# Patient Record
Sex: Male | Born: 1935 | Race: White | Hispanic: No | Smoking: Never smoker
Health system: Southern US, Community
[De-identification: ages and names within clinical notes are randomized; demographics above are authoritative.]

## PROBLEM LIST (undated history)

## (undated) DIAGNOSIS — N189 Chronic kidney disease, unspecified: Secondary | ICD-10-CM

## (undated) DIAGNOSIS — F22 Delusional disorders: Secondary | ICD-10-CM

## (undated) DIAGNOSIS — K219 Gastro-esophageal reflux disease without esophagitis: Secondary | ICD-10-CM

## (undated) DIAGNOSIS — E785 Hyperlipidemia, unspecified: Secondary | ICD-10-CM

## (undated) DIAGNOSIS — E039 Hypothyroidism, unspecified: Secondary | ICD-10-CM

## (undated) DIAGNOSIS — I2510 Atherosclerotic heart disease of native coronary artery without angina pectoris: Secondary | ICD-10-CM

## (undated) DIAGNOSIS — D649 Anemia, unspecified: Secondary | ICD-10-CM

## (undated) DIAGNOSIS — Z87891 Personal history of nicotine dependence: Secondary | ICD-10-CM

## (undated) DIAGNOSIS — R1310 Dysphagia, unspecified: Secondary | ICD-10-CM

## (undated) DIAGNOSIS — F1027 Alcohol dependence with alcohol-induced persisting dementia: Secondary | ICD-10-CM

## (undated) DIAGNOSIS — R41841 Cognitive communication deficit: Secondary | ICD-10-CM

## (undated) DIAGNOSIS — G9340 Encephalopathy, unspecified: Secondary | ICD-10-CM

## (undated) DIAGNOSIS — I10 Essential (primary) hypertension: Secondary | ICD-10-CM

## (undated) DIAGNOSIS — R569 Unspecified convulsions: Secondary | ICD-10-CM

## (undated) DIAGNOSIS — F411 Generalized anxiety disorder: Secondary | ICD-10-CM

## (undated) DIAGNOSIS — F0390 Unspecified dementia without behavioral disturbance: Secondary | ICD-10-CM

## (undated) DIAGNOSIS — I259 Chronic ischemic heart disease, unspecified: Secondary | ICD-10-CM

## (undated) DIAGNOSIS — F329 Major depressive disorder, single episode, unspecified: Secondary | ICD-10-CM

---

## 2014-02-19 ENCOUNTER — Ambulatory Visit: Payer: Self-pay | Admitting: Family Medicine

## 2016-04-13 ENCOUNTER — Emergency Department (HOSPITAL_COMMUNITY): Payer: Medicare Other

## 2016-04-13 ENCOUNTER — Inpatient Hospital Stay (HOSPITAL_COMMUNITY)
Admission: EM | Admit: 2016-04-13 | Discharge: 2016-04-18 | DRG: 177 | Disposition: A | Discharge status: Skilled Nursing Facility | Payer: Medicare Other | Attending: Internal Medicine | Admitting: Internal Medicine

## 2016-04-13 ENCOUNTER — Encounter (HOSPITAL_COMMUNITY): Payer: Self-pay | Admitting: Emergency Medicine

## 2016-04-13 DIAGNOSIS — E876 Hypokalemia: Secondary | ICD-10-CM | POA: Diagnosis present

## 2016-04-13 DIAGNOSIS — R079 Chest pain, unspecified: Secondary | ICD-10-CM | POA: Diagnosis not present

## 2016-04-13 DIAGNOSIS — Z9181 History of falling: Secondary | ICD-10-CM | POA: Diagnosis not present

## 2016-04-13 DIAGNOSIS — H9190 Unspecified hearing loss, unspecified ear: Secondary | ICD-10-CM | POA: Diagnosis present

## 2016-04-13 DIAGNOSIS — Z7902 Long term (current) use of antithrombotics/antiplatelets: Secondary | ICD-10-CM | POA: Diagnosis not present

## 2016-04-13 DIAGNOSIS — I2510 Atherosclerotic heart disease of native coronary artery without angina pectoris: Secondary | ICD-10-CM | POA: Diagnosis present

## 2016-04-13 DIAGNOSIS — I248 Other forms of acute ischemic heart disease: Secondary | ICD-10-CM | POA: Diagnosis present

## 2016-04-13 DIAGNOSIS — K219 Gastro-esophageal reflux disease without esophagitis: Secondary | ICD-10-CM | POA: Diagnosis present

## 2016-04-13 DIAGNOSIS — J189 Pneumonia, unspecified organism: Secondary | ICD-10-CM | POA: Diagnosis present

## 2016-04-13 DIAGNOSIS — R1312 Dysphagia, oropharyngeal phase: Secondary | ICD-10-CM | POA: Diagnosis present

## 2016-04-13 DIAGNOSIS — F0390 Unspecified dementia without behavioral disturbance: Secondary | ICD-10-CM | POA: Diagnosis not present

## 2016-04-13 DIAGNOSIS — I272 Other secondary pulmonary hypertension: Secondary | ICD-10-CM | POA: Diagnosis present

## 2016-04-13 DIAGNOSIS — F411 Generalized anxiety disorder: Secondary | ICD-10-CM | POA: Diagnosis present

## 2016-04-13 DIAGNOSIS — G9341 Metabolic encephalopathy: Secondary | ICD-10-CM | POA: Diagnosis present

## 2016-04-13 DIAGNOSIS — N179 Acute kidney failure, unspecified: Secondary | ICD-10-CM | POA: Diagnosis present

## 2016-04-13 DIAGNOSIS — Z515 Encounter for palliative care: Secondary | ICD-10-CM | POA: Diagnosis present

## 2016-04-13 DIAGNOSIS — Y95 Nosocomial condition: Secondary | ICD-10-CM | POA: Diagnosis present

## 2016-04-13 DIAGNOSIS — G40909 Epilepsy, unspecified, not intractable, without status epilepticus: Secondary | ICD-10-CM | POA: Diagnosis present

## 2016-04-13 DIAGNOSIS — I481 Persistent atrial fibrillation: Secondary | ICD-10-CM | POA: Diagnosis present

## 2016-04-13 DIAGNOSIS — Z66 Do not resuscitate: Secondary | ICD-10-CM | POA: Diagnosis present

## 2016-04-13 DIAGNOSIS — F22 Delusional disorders: Secondary | ICD-10-CM | POA: Diagnosis present

## 2016-04-13 DIAGNOSIS — G9340 Encephalopathy, unspecified: Secondary | ICD-10-CM | POA: Diagnosis not present

## 2016-04-13 DIAGNOSIS — R1310 Dysphagia, unspecified: Secondary | ICD-10-CM | POA: Diagnosis not present

## 2016-04-13 DIAGNOSIS — J9621 Acute and chronic respiratory failure with hypoxia: Secondary | ICD-10-CM | POA: Diagnosis present

## 2016-04-13 DIAGNOSIS — D72829 Elevated white blood cell count, unspecified: Secondary | ICD-10-CM | POA: Diagnosis present

## 2016-04-13 DIAGNOSIS — E785 Hyperlipidemia, unspecified: Secondary | ICD-10-CM | POA: Diagnosis present

## 2016-04-13 DIAGNOSIS — J690 Pneumonitis due to inhalation of food and vomit: Principal | ICD-10-CM | POA: Diagnosis present

## 2016-04-13 DIAGNOSIS — F0391 Unspecified dementia with behavioral disturbance: Secondary | ICD-10-CM | POA: Diagnosis present

## 2016-04-13 DIAGNOSIS — N189 Chronic kidney disease, unspecified: Secondary | ICD-10-CM | POA: Diagnosis present

## 2016-04-13 DIAGNOSIS — Z87891 Personal history of nicotine dependence: Secondary | ICD-10-CM

## 2016-04-13 DIAGNOSIS — I129 Hypertensive chronic kidney disease with stage 1 through stage 4 chronic kidney disease, or unspecified chronic kidney disease: Secondary | ICD-10-CM | POA: Diagnosis present

## 2016-04-13 DIAGNOSIS — R633 Feeding difficulties: Secondary | ICD-10-CM | POA: Diagnosis present

## 2016-04-13 DIAGNOSIS — D638 Anemia in other chronic diseases classified elsewhere: Secondary | ICD-10-CM | POA: Diagnosis present

## 2016-04-13 DIAGNOSIS — I480 Paroxysmal atrial fibrillation: Secondary | ICD-10-CM | POA: Diagnosis present

## 2016-04-13 DIAGNOSIS — R7989 Other specified abnormal findings of blood chemistry: Secondary | ICD-10-CM | POA: Diagnosis present

## 2016-04-13 DIAGNOSIS — J90 Pleural effusion, not elsewhere classified: Secondary | ICD-10-CM | POA: Diagnosis present

## 2016-04-13 DIAGNOSIS — R778 Other specified abnormalities of plasma proteins: Secondary | ICD-10-CM | POA: Diagnosis present

## 2016-04-13 DIAGNOSIS — E870 Hyperosmolality and hypernatremia: Secondary | ICD-10-CM | POA: Diagnosis present

## 2016-04-13 DIAGNOSIS — Z8701 Personal history of pneumonia (recurrent): Secondary | ICD-10-CM

## 2016-04-13 DIAGNOSIS — E039 Hypothyroidism, unspecified: Secondary | ICD-10-CM | POA: Diagnosis present

## 2016-04-13 DIAGNOSIS — E860 Dehydration: Secondary | ICD-10-CM | POA: Diagnosis present

## 2016-04-13 DIAGNOSIS — R05 Cough: Secondary | ICD-10-CM | POA: Diagnosis present

## 2016-04-13 DIAGNOSIS — I4819 Other persistent atrial fibrillation: Secondary | ICD-10-CM

## 2016-04-13 HISTORY — DX: Essential (primary) hypertension: I10

## 2016-04-13 HISTORY — DX: Cognitive communication deficit: R41.841

## 2016-04-13 HISTORY — DX: Unspecified convulsions: R56.9

## 2016-04-13 HISTORY — DX: Alcohol dependence with alcohol-induced persisting dementia: F10.27

## 2016-04-13 HISTORY — DX: Hyperlipidemia, unspecified: E78.5

## 2016-04-13 HISTORY — DX: Chronic kidney disease, unspecified: N18.9

## 2016-04-13 HISTORY — DX: Dysphagia, unspecified: R13.10

## 2016-04-13 HISTORY — DX: Gastro-esophageal reflux disease without esophagitis: K21.9

## 2016-04-13 HISTORY — DX: Delusional disorders: F22

## 2016-04-13 HISTORY — DX: Atherosclerotic heart disease of native coronary artery without angina pectoris: I25.10

## 2016-04-13 HISTORY — DX: Personal history of nicotine dependence: Z87.891

## 2016-04-13 HISTORY — DX: Encephalopathy, unspecified: G93.40

## 2016-04-13 HISTORY — DX: Anemia, unspecified: D64.9

## 2016-04-13 HISTORY — DX: Hypothyroidism, unspecified: E03.9

## 2016-04-13 HISTORY — DX: Major depressive disorder, single episode, unspecified: F32.9

## 2016-04-13 HISTORY — DX: Generalized anxiety disorder: F41.1

## 2016-04-13 HISTORY — DX: Unspecified dementia, unspecified severity, without behavioral disturbance, psychotic disturbance, mood disturbance, and anxiety: F03.90

## 2016-04-13 HISTORY — DX: Chronic ischemic heart disease, unspecified: I25.9

## 2016-04-13 LAB — VALPROIC ACID LEVEL: Valproic Acid Lvl: 37 ug/mL — ABNORMAL LOW (ref 50.0–100.0)

## 2016-04-13 LAB — CBC WITH DIFFERENTIAL/PLATELET
Basophils Absolute: 0 10*3/uL (ref 0.0–0.1)
Basophils Relative: 0 %
EOS ABS: 0 10*3/uL (ref 0.0–0.7)
EOS PCT: 0 %
HCT: 27.6 % — ABNORMAL LOW (ref 39.0–52.0)
HEMOGLOBIN: 8.9 g/dL — AB (ref 13.0–17.0)
LYMPHS ABS: 1.6 10*3/uL (ref 0.7–4.0)
Lymphocytes Relative: 11 %
MCH: 29.3 pg (ref 26.0–34.0)
MCHC: 32.2 g/dL (ref 30.0–36.0)
MCV: 90.8 fL (ref 78.0–100.0)
MONOS PCT: 7 %
Monocytes Absolute: 1 10*3/uL (ref 0.1–1.0)
NEUTROS PCT: 82 %
Neutro Abs: 11.8 10*3/uL — ABNORMAL HIGH (ref 1.7–7.7)
Platelets: 299 10*3/uL (ref 150–400)
RBC: 3.04 MIL/uL — ABNORMAL LOW (ref 4.22–5.81)
RDW: 14.5 % (ref 11.5–15.5)
WBC: 14.4 10*3/uL — ABNORMAL HIGH (ref 4.0–10.5)

## 2016-04-13 LAB — TROPONIN I
Troponin I: 0.47 ng/mL — ABNORMAL HIGH (ref <0.031)
Troponin I: 0.41 ng/mL — ABNORMAL HIGH (ref <0.031)
Troponin I: 0.36 ng/mL — ABNORMAL HIGH (ref <0.031)

## 2016-04-13 LAB — COMPREHENSIVE METABOLIC PANEL
ALBUMIN: 2.9 g/dL — AB (ref 3.5–5.0)
ALK PHOS: 47 U/L (ref 38–126)
ALT: 21 U/L (ref 17–63)
AST: 23 U/L (ref 15–41)
Anion gap: 9 (ref 5–15)
BUN: 27 mg/dL — ABNORMAL HIGH (ref 6–20)
CALCIUM: 8.9 mg/dL (ref 8.9–10.3)
CHLORIDE: 109 mmol/L (ref 101–111)
CO2: 29 mmol/L (ref 22–32)
CREATININE: 1.31 mg/dL — AB (ref 0.61–1.24)
GFR calc non Af Amer: 47 mL/min — ABNORMAL LOW (ref >60)
GFR, EST AFRICAN AMERICAN: 55 mL/min — AB (ref >60)
GLUCOSE: 129 mg/dL — AB (ref 65–99)
Potassium: 3.6 mmol/L (ref 3.5–5.1)
SODIUM: 147 mmol/L — AB (ref 135–145)
Total Bilirubin: 0.6 mg/dL (ref 0.3–1.2)
Total Protein: 7 g/dL (ref 6.5–8.1)

## 2016-04-13 LAB — PROTIME-INR
INR: 1.35 (ref 0.00–1.49)
PROTHROMBIN TIME: 16.8 s — AB (ref 11.6–15.2)

## 2016-04-13 LAB — MRSA PCR SCREENING: MRSA BY PCR: NEGATIVE

## 2016-04-13 LAB — LACTIC ACID, PLASMA: LACTIC ACID, VENOUS: 1.4 mmol/L (ref 0.5–2.0)

## 2016-04-13 MED ORDER — DEXTROSE 5 % IV SOLN
1.0000 g | Freq: Once | INTRAVENOUS | Status: AC
  Administered 2016-04-13: 1 g via INTRAVENOUS
  Filled 2016-04-13: qty 1

## 2016-04-13 MED ORDER — VANCOMYCIN HCL 10 G IV SOLR
1750.0000 mg | Freq: Once | INTRAVENOUS | Status: AC
  Administered 2016-04-13: 1750 mg via INTRAVENOUS
  Filled 2016-04-13: qty 1750

## 2016-04-13 MED ORDER — ACETAMINOPHEN 650 MG RE SUPP: 650.0000 mg | Freq: Four times a day (QID) | RECTAL | Status: DC | PRN

## 2016-04-13 MED ORDER — ONDANSETRON HCL 4 MG/2ML IJ SOLN: 4.0000 mg | Freq: Four times a day (QID) | INTRAMUSCULAR | Status: DC | PRN

## 2016-04-13 MED ORDER — ADULT MULTIVITAMIN W/MINERALS CH
1.0000 | ORAL_TABLET | Freq: Every day | ORAL | Status: DC
  Administered 2016-04-14 – 2016-04-18 (×5): 1 via ORAL
  Filled 2016-04-13 (×5): qty 1

## 2016-04-13 MED ORDER — ESCITALOPRAM OXALATE 10 MG PO TABS
10.0000 mg | ORAL_TABLET | Freq: Every day | ORAL | Status: DC
  Administered 2016-04-13 – 2016-04-18 (×6): 10 mg via ORAL
  Filled 2016-04-13 (×6): qty 1

## 2016-04-13 MED ORDER — ACETAMINOPHEN 325 MG PO TABS
650.0000 mg | ORAL_TABLET | Freq: Four times a day (QID) | ORAL | Status: DC | PRN
  Administered 2016-04-14: 650 mg via ORAL
  Filled 2016-04-13: qty 2

## 2016-04-13 MED ORDER — SODIUM CHLORIDE 0.9 % IV SOLN
INTRAVENOUS | Status: AC
  Administered 2016-04-13: 12:00:00 via INTRAVENOUS

## 2016-04-13 MED ORDER — VALPROIC ACID 250 MG PO CAPS
250.0000 mg | ORAL_CAPSULE | Freq: Three times a day (TID) | ORAL | Status: DC
  Administered 2016-04-13 (×2): 250 mg via ORAL
  Filled 2016-04-13 (×9): qty 1

## 2016-04-13 MED ORDER — CLONAZEPAM 0.5 MG PO TABS
0.2500 mg | ORAL_TABLET | Freq: Every day | ORAL | Status: DC
  Administered 2016-04-13 – 2016-04-17 (×4): 0.25 mg via ORAL
  Filled 2016-04-13 (×4): qty 1

## 2016-04-13 MED ORDER — CLOPIDOGREL BISULFATE 75 MG PO TABS
75.0000 mg | ORAL_TABLET | Freq: Every day | ORAL | Status: DC
  Administered 2016-04-13 – 2016-04-18 (×6): 75 mg via ORAL
  Filled 2016-04-13 (×6): qty 1

## 2016-04-13 MED ORDER — SODIUM CHLORIDE 0.9 % IV SOLN
3.0000 g | Freq: Three times a day (TID) | INTRAVENOUS | Status: DC
  Administered 2016-04-13 – 2016-04-18 (×15): 3 g via INTRAVENOUS
  Filled 2016-04-13 (×18): qty 3

## 2016-04-13 MED ORDER — RISPERIDONE 1 MG PO TABS
2.0000 mg | ORAL_TABLET | Freq: Two times a day (BID) | ORAL | Status: DC
  Administered 2016-04-13 – 2016-04-18 (×9): 2 mg via ORAL
  Filled 2016-04-13 (×9): qty 2

## 2016-04-13 MED ORDER — SODIUM CHLORIDE 0.9 % IV BOLUS (SEPSIS)
1000.0000 mL | Freq: Once | INTRAVENOUS | Status: AC
  Administered 2016-04-13: 1000 mL via INTRAVENOUS

## 2016-04-13 MED ORDER — VANCOMYCIN HCL 10 G IV SOLR
1250.0000 mg | INTRAVENOUS | Status: DC
  Filled 2016-04-13: qty 1250

## 2016-04-13 MED ORDER — SENNOSIDES-DOCUSATE SODIUM 8.6-50 MG PO TABS: 1.0000 | ORAL_TABLET | Freq: Every evening | ORAL | Status: DC | PRN

## 2016-04-13 MED ORDER — ENOXAPARIN SODIUM 40 MG/0.4ML ~~LOC~~ SOLN
40.0000 mg | SUBCUTANEOUS | Status: DC
  Administered 2016-04-13 – 2016-04-17 (×4): 40 mg via SUBCUTANEOUS
  Filled 2016-04-13 (×4): qty 0.4

## 2016-04-13 MED ORDER — ONDANSETRON HCL 4 MG PO TABS: 4.0000 mg | ORAL_TABLET | Freq: Four times a day (QID) | ORAL | Status: DC | PRN

## 2016-04-13 MED ORDER — SODIUM CHLORIDE 0.45 % IV SOLN
INTRAVENOUS | Status: DC
  Administered 2016-04-13 – 2016-04-14 (×2): via INTRAVENOUS

## 2016-04-13 MED ORDER — ISOSORBIDE DINITRATE 20 MG PO TABS
30.0000 mg | ORAL_TABLET | Freq: Every day | ORAL | Status: DC
  Administered 2016-04-14 – 2016-04-18 (×5): 30 mg via ORAL
  Filled 2016-04-13 (×5): qty 2

## 2016-04-13 NOTE — Progress Notes (Signed)
Pharmacy Antibiotic Note  Jerry [name] is a 82 y.o. male admitted on 04/13/2016 with pneumonia.  Pharmacy has been consulted for vancomycin dosing. He was [facility] with dx of PNA  Plan: Vanc 1750 mg IV X 1 then 1250 mg IV q24 hours F/u renal function, cultures and clinical course  Height: 5\' 11"  (180.3 cm) Weight: 195 lb (88.451 kg) IBW/kg (Calculated) : 75.3  Temp (24hrs), Avg:99.9 F (37.7 C), Min:99.9 F (37.7 C), Max:99.9 F (37.7 C)   Recent Labs Lab 04/13/16 1030  WBC 14.4*  CREATININE 1.31*  LATICACIDVEN 1.4    Estimated Creatinine Clearance: 42.3 mL/min (by C-G formula based on Cr of 1.31).    No Known Allergies  Antimicrobials this admission: vanc 4/25 >>  cefepime 4/25 >>   Thank you for allowing pharmacy to be a part of this patient's care.  Talbert CageSeay, Tadao Emig Poteet 04/13/2016 11:51 AM

## 2016-04-13 NOTE — Progress Notes (Signed)
Pharmacy Antibiotic Note-update  Jerry [name] is a 82 y.o. male admitted on 04/13/2016 with aspiration  pneumonia.  Pharmacy has been consulted for unasyn dosing. He was [facility] on [facility] with dx of PNA  Plan: Unasyn 3gm IV q8h F/u renal function, cultures and clinical course  Height: 5\' 11"  (180.3 cm) Weight: 180 lb 1.9 oz (81.7 kg) IBW/kg (Calculated) : 75.3  Temp (24hrs), Avg:98.9 F (37.2 C), Min:97.9 F (36.6 C), Max:99.9 F (37.7 C)   Recent Labs Lab 04/13/16 1030  WBC 14.4*  CREATININE 1.31*  LATICACIDVEN 1.4    Estimated Creatinine Clearance: 42.3 mL/min (by C-G formula based on Cr of 1.31).    No Known Allergies  Antimicrobials this admission: vanc 4/25 >> 4/25 cefepime 4/25 >>4/25 Unasyn 4/25>>    Thank you for allowing pharmacy to be a part of this patient's care.  Jerry [name], BS Jerry [name], [place] Clinical Pharmacist Pager [phone] 04/13/2016 5:09 PM

## 2016-04-13 NOTE — H&P (Signed)
History and Physical    Axell Garrido ZOX:[id number] DOB: 03/28/1926 DOA: 04/13/2016  Referring MD/NP/PA: Sheliah Hatch, M.D. PCP: Audree Bane, DO  Patient coming from: Skilled nursing facility  Chief Complaint: Cough, lethargy  HPI: Jerry [name] is a 82 y.o. male with multiple medical comorbidities including ischemic heart disease, seizure disorder, hypertension, severe dementia who presents to the hospital today with the above complaints. Unfortunately patient is not very talkative and am unable to obtain history from him. History is obtained by chart review. It appears that over the past week patient has had worsening cough with increased sputum production. He has not been febrile. He has severe dementia at baseline. He was found to be hypoxic with O2 saturations about 88% upon arrival to the hospital. Chest x-ray shows a right lower lobe pneumonia, he has a sodium of 147, creatinine of 1.3 and a troponin level is 0.47, WBC count of 14.4. We have been asked to admit him for further evaluation and management   Past Medical History  Diagnosis Date  . Ischemic heart disease     on plavix  . Encephalopathy   . Hypertension   . Alcohol dependence with alcohol-induced persisting dementia (HCC)   . Dysphagia   . GAD (generalized anxiety disorder)   . CKD (chronic kidney disease)   . MDD (major depressive disorder) (HCC)   . Hyperlipemia   . Cognitive communication deficit   . GERD (gastroesophageal reflux disease)   . Hypothyroidism   . Anemia   . Dementia   . Delusional disorder (HCC)   . Seizures (HCC)     on Valproic acid  . Coronary artery disease   . Former smoker, stopped smoking in distant past     History reviewed. No pertinent past surgical history.   reports that he drinks alcohol. His tobacco and drug histories are not on file.  No Known Allergies  Family history: Unable to obtain given his severe dementia  Prior to Admission medications   Medication Sig  Start Date End Date Taking? Authorizing Provider  acetaminophen (TYLENOL) 325 MG tablet Take 650 mg by mouth every 6 (six) hours as needed.   Yes Historical Provider, MD  bisacodyl (DULCOLAX) 10 MG suppository Place 10 mg rectally every 3 (three) days as needed for moderate constipation.   Yes Historical Provider, MD  clonazePAM (KLONOPIN) 0.25 MG disintegrating tablet Take 0.25 mg by mouth at bedtime.   Yes Historical Provider, MD  clopidogrel (PLAVIX) 75 MG tablet Take 75 mg by mouth daily.   Yes Historical Provider, MD  escitalopram (LEXAPRO) 10 MG tablet Take 10 mg by mouth daily.   Yes Historical Provider, MD  isosorbide dinitrate (ISORDIL) 30 MG tablet Take 30 mg by mouth daily.   Yes Historical Provider, MD  lactulose (CHRONULAC) 10 GM/15ML solution Place 10 g rectally every 3 (three) days as needed for mild constipation.   Yes Historical Provider, MD  LORazepam (ATIVAN) 2 MG/ML injection Inject 2 mg into the vein every 2 (two) hours as needed for anxiety.   Yes Historical Provider, MD  Multiple Vitamins-Minerals (MULTIVITAMIN WITH MINERALS) tablet Take 1 tablet by mouth daily.   Yes Historical Provider, MD  risperiDONE (RISPERDAL) 2 MG tablet Take 2 mg by mouth 2 (two) times daily.   Yes Historical Provider, MD  senna (SENOKOT) 8.6 MG tablet Take 2 tablets by mouth daily.   Yes Historical Provider, MD  Valproic Acid 250 MG CPDR Take 1 capsule by mouth 3 (three) times daily.  Yes Historical Provider, MD    Review of Systems:  Unable to obtain given his severe dementia   Physical Exam: Filed Vitals:   04/13/16 1200 04/13/16 1230 04/13/16 1300 04/13/16 1442  BP: 137/84 125/73 131/71 125/67  Pulse: 86 76  82  Temp:    97.9 F (36.6 C)  TempSrc:    Oral  Resp: 17 18 18 20   Height:      Weight:    81.7 kg (180 lb 1.9 oz)  SpO2: 97% 98% 98% 94%      Constitutional: NAD, calm, comfortable Eyes: PERRL, lids and conjunctivae normal ENMT: Mucous membranes are moist. Posterior  pharynx clear of any exudate or lesions.Normal dentition.  Neck: normal, supple, no masses, no thyromegaly Respiratory: Coarse bilateral breath sounds, decreased breath sounds right base  Cardiovascular: Regular rate and rhythm, no murmurs / rubs / gallops. No extremity edema. 2+ pedal pulses. No carotid bruits.  Abdomen: no tenderness, no masses palpated. No hepatosplenomegaly. Bowel sounds positive.  Musculoskeletal: no clubbing / cyanosis. No joint deformity upper and lower extremities. Good ROM, no contractures. Normal muscle tone.  Skin: no rashes, lesions, ulcers. No induration Neurologic: CN 2-12 grossly intact. Sensation intact, DTR normal. Strength 5/5 in all 4.  Psychiatric: Normal judgment and insight. Alert and oriented x 3. Normal mood.    Labs on Admission: I have personally reviewed following labs and imaging studies  CBC:  Recent Labs Lab 04/13/16 1030  WBC 14.4*  NEUTROABS 11.8*  HGB 8.9*  HCT 27.6*  MCV 90.8  PLT 299   Basic Metabolic Panel:  Recent Labs Lab 04/13/16 1030  NA 147*  K 3.6  CL 109  CO2 29  GLUCOSE 129*  BUN 27*  CREATININE 1.31*  CALCIUM 8.9   GFR: Estimated Creatinine Clearance: 42.3 mL/min (by C-G formula based on Cr of 1.31). Liver Function Tests:  Recent Labs Lab 04/13/16 1030  AST 23  ALT 21  ALKPHOS 47  BILITOT 0.6  PROT 7.0  ALBUMIN 2.9*   No results for input(s): LIPASE, AMYLASE in the last 168 hours. No results for input(s): AMMONIA in the last 168 hours. Coagulation Profile:  Recent Labs Lab 04/13/16 1030  INR 1.35   Cardiac Enzymes:  Recent Labs Lab 04/13/16 1030  TROPONINI 0.47*   BNP (last 3 results) No results for input(s): PROBNP in the last 8760 hours. HbA1C: No results for input(s): HGBA1C in the last 72 hours. CBG: No results for input(s): GLUCAP in the last 168 hours. Lipid Profile: No results for input(s): CHOL, HDL, LDLCALC, TRIG, CHOLHDL, LDLDIRECT in the last 72 hours. Thyroid Function  Tests: No results for input(s): TSH, T4TOTAL, FREET4, T3FREE, THYROIDAB in the last 72 hours. Anemia Panel: No results for input(s): VITAMINB12, FOLATE, FERRITIN, TIBC, IRON, RETICCTPCT in the last 72 hours. Urine analysis: No results found for: COLORURINE, APPEARANCEUR, LABSPEC, PHURINE, GLUCOSEU, HGBUR, BILIRUBINUR, KETONESUR, PROTEINUR, UROBILINOGEN, NITRITE, LEUKOCYTESUR Sepsis Labs: @LABRCNTIP (procalcitonin:4,lacticidven:4) )No results found for this or any previous visit (from the past 240 hour(s)).   Radiological Exams on Admission: Dg Chest Portable 1 View  04/13/2016  CLINICAL DATA:  Cough, possible pneumonia EXAM: PORTABLE CHEST 1 VIEW COMPARISON:  None. FINDINGS: Cardiomediastinal silhouette is unremarkable. There is hazy infiltrate in right base suspicious for pneumonia. No pulmonary edema. Mild left basilar atelectasis or infiltrate. IMPRESSION: There is hazy infiltrate in right base suspicious for pneumonia. No pulmonary edema. Mild left basilar atelectasis or infiltrate. Followup PA and lateral chest X-ray is recommended in 3-4 weeks  following trial of antibiotic therapy to ensure resolution and exclude underlying malignancy. Electronically Signed   By: Natasha Mead M.D.   On: 04/13/2016 10:43    EKG: Independently reviewed. Atrial fibrillation at a rate of 92, no acute ischemic abnormalities  Assessment/Plan Active Problems:   HCAP (healthcare-associated pneumonia)   Dementia   ARF (acute renal failure) (HCC)   Hypernatremia   Leukocytosis   Elevated troponin   Aspiration pneumonia (HCC)    Acute on chronic metabolic encephalopathy -Likely due to pneumonia on top of baseline dementia.  Hospital-acquired pneumonia/aspiration pneumonia -Given patient's severe dementia and infiltrate located at right base, suspect this is aspiration nature. -We'll request Unasyn dosed as per pharmacy, blood and sputum cultures requested. -Has already been seen by speech therapy who will  proceed with a modified barium swallow in a.m.  Acute renal failure  -suspect due to dehydration. -Follow with IV fluids.  Hypernatremia -Sodium was 147 on admission, suspect due to dementia and decreased intravascular volume. -Start on half normal saline at 75 mL an hour and recheck in a.m.  Elevated troponin -We'll continue to cycle troponins, currently no acute ischemic abnormalities on EKG although he does have what appears to be new onset A. fib. -Check 2-D echo, no further workup anticipated as long as ejection fraction is intact and no wall motion abnormalities.  New onset atrial fibrillation -Rate controlled. -Would not be a candidate for anticoagulation given his severe dementia and fall risk.  Dementia -At baseline   DVT prophylaxis: Lovenox  Code Status: Presumed full code  Family Communication: Patient only  Disposition Plan: To be determined  Consults called: Palliative care consult requested given advanced age, dementia and aspiration pneumonia   Admission status: Inpatient    Time Spent: 95 minutes  Chaya Jan MD [facility] Pager [phone]  If 7PM-7AM, please contact night-coverage [email or web address] Password [facility]  04/13/2016, 4:44 PM

## 2016-04-13 NOTE — ED Provider Notes (Signed)
CSN: [id number]     Arrival date & time 04/13/16  4098 History   First MD Initiated Contact with Patient 04/13/16 1017     Chief Complaint  Patient presents with  . Cough    (Consider location/radiation/quality/duration/timing/severity/associated sxs/prior Treatment) Patient is a 82 y.o. Jerry [name] presenting with cough. The history is provided by the nursing home, the EMS personnel and medical records.  Cough *Patient unable to provide history due to mental status  82 y/o man brought to ED from nursing home where he was noted to have worsening cough and increased sputum production. These symptoms were progressive over the past week. He has not been clearly febrile during the interval. His mental status is questionably worsened with incoherent speech since this morning, however he has severe dementia at baseline. After arrival in the ED he is noted to be mildly hypoxic with O2 saturation <88% that improves with supplemental oxygen. Heart rate mildly tachycardic in Afib which is not documented in nursing home records PTA.  Past Medical History  Diagnosis Date  . Ischemic heart disease     on plavix  . Encephalopathy   . Hypertension   . Alcohol dependence with alcohol-induced persisting dementia (HCC)   . Dysphagia   . GAD (generalized anxiety disorder)   . CKD (chronic kidney disease)   . MDD (major depressive disorder) (HCC)   . Hyperlipemia   . Cognitive communication deficit   . GERD (gastroesophageal reflux disease)   . Hypothyroidism   . Anemia   . Dementia   . Delusional disorder (HCC)   . Seizures (HCC)     on Valproic acid  . Coronary artery disease    History reviewed. No pertinent past surgical history. History reviewed. No pertinent family history. Social History  Substance Use Topics  . Smoking status: Unknown If Ever Smoked  . Smokeless tobacco: None  . Alcohol Use: Yes     Comment: previous alcohol abuse    Review of Systems  Unable to perform ROS: Mental  status change  Respiratory: Positive for cough.   Level V caveat due to patient dementia, AMS, mostly nonverbal status.    Allergies  Review of patient's allergies indicates no known allergies.  Home Medications   Prior to Admission medications   Medication Sig Start Date End Date Taking? Authorizing Provider  acetaminophen (TYLENOL) 325 MG tablet Take 650 mg by mouth every 6 (six) hours as needed.   Yes Historical Provider, MD  bisacodyl (DULCOLAX) 10 MG suppository Place 10 mg rectally every 3 (three) days as needed for moderate constipation.   Yes Historical Provider, MD  clonazePAM (KLONOPIN) 0.25 MG disintegrating tablet Take 0.25 mg by mouth at bedtime.   Yes Historical Provider, MD  clopidogrel (PLAVIX) 75 MG tablet Take 75 mg by mouth daily.   Yes Historical Provider, MD  escitalopram (LEXAPRO) 10 MG tablet Take 10 mg by mouth daily.   Yes Historical Provider, MD  isosorbide dinitrate (ISORDIL) 30 MG tablet Take 30 mg by mouth daily.   Yes Historical Provider, MD  lactulose (CHRONULAC) 10 GM/15ML solution Place 10 g rectally every 3 (three) days as needed for mild constipation.   Yes Historical Provider, MD  LORazepam (ATIVAN) 2 MG/ML injection Inject 2 mg into the vein every 2 (two) hours as needed for anxiety.   Yes Historical Provider, MD  Multiple Vitamins-Minerals (MULTIVITAMIN WITH MINERALS) tablet Take 1 tablet by mouth daily.   Yes Historical Provider, MD  risperiDONE (RISPERDAL) 2 MG tablet Take 2  mg by mouth 2 (two) times daily.   Yes Historical Provider, MD  senna (SENOKOT) 8.6 MG tablet Take 2 tablets by mouth daily.   Yes Historical Provider, MD  Valproic Acid 250 MG CPDR Take 1 capsule by mouth 3 (three) times daily.   Yes Historical Provider, MD   BP 125/67 mmHg  Pulse 82  Temp(Src) 97.9 F (36.6 C) (Oral)  Resp 20  Ht  (1.803 m)  Wt 81.7 kg  BMI 25.13 kg/m2  SpO2 94% Physical Exam GENERAL- alert, co-operative, NAD HEENT- Conjunctiva noninjected, oral  mucosa appears moist, no cervical LN enlargement. CARDIAC- Regular rate, irregular rhythm, early systolic murmur on LUSB RESP- No wheezes or crackles, decreased breath sounds in right base ABDOMEN- Soft, no guarding NEURO- Limited by patient mental status EXTREMITIES- symmetric, no pedal edema. SKIN- Warm, dry, No rash or lesion. PSYCH- Speaking in single words, inappropriate or no responses to questions   ED Course  Procedures (including critical care time) Labs Review Labs Reviewed  CBC WITH DIFFERENTIAL/PLATELET - Abnormal; Notable for the following:    WBC 14.4 (*)    RBC 3.04 (*)    Hemoglobin 8.9 (*)    HCT 27.6 (*)    Neutro Abs 11.8 (*)    All other components within normal limits  COMPREHENSIVE METABOLIC PANEL - Abnormal; Notable for the following:    Sodium 147 (*)    Glucose, Bld 129 (*)    BUN 27 (*)    Creatinine, Ser 1.31 (*)    Albumin 2.9 (*)    GFR calc non Af Amer 47 (*)    GFR calc Af Amer 55 (*)    All other components within normal limits  TROPONIN I - Abnormal; Notable for the following:    Troponin I 0.47 (*)    All other components within normal limits  PROTIME-INR - Abnormal; Notable for the following:    Prothrombin Time 16.8 (*)    All other components within normal limits  VALPROIC ACID LEVEL - Abnormal; Notable for the following:    Valproic Acid Lvl 37 (*)    All other components within normal limits  LACTIC ACID, PLASMA    Imaging Review Dg Chest Portable 1 View  04/13/2016  CLINICAL DATA:  Cough, possible pneumonia EXAM: PORTABLE CHEST 1 VIEW COMPARISON:  None. FINDINGS: Cardiomediastinal silhouette is unremarkable. There is hazy infiltrate in right base suspicious for pneumonia. No pulmonary edema. Mild left basilar atelectasis or infiltrate. IMPRESSION: There is hazy infiltrate in right base suspicious for pneumonia. No pulmonary edema. Mild left basilar atelectasis or infiltrate. Followup PA and lateral chest X-ray is recommended in 3-4  weeks following trial of antibiotic therapy to ensure resolution and exclude underlying malignancy. Electronically Signed   By: Natasha Mead M.D.   On: 04/13/2016 10:43   I have personally reviewed and evaluated these images and lab results as part of my medical decision-making.   EKG Interpretation   Date/Time:  Tuesday April 13 2016 10:18:18 EDT Ventricular Rate:  92 PR Interval:    QRS Duration: 114 QT Interval:  408 QTC Calculation: 505 R Axis:   -58 Text Interpretation:  Atrial fibrillation Incomplete left bundle branch  block Prolonged QT interval Confirmed by ZAVITZ  MD, JOSHUA ([id number]) on  04/13/2016 3:26:20 PM      MDM   Final diagnoses:  HCAP (healthcare-associated pneumonia)   82 y/o man presenting from nursing home with clinical and radiographic findings consistent with pneumonia. He is requiring supplemental  oxygen. Heart rate is irregular in afib since presentation. Mild troponin elevation possibly demand ischemia. Laboratory studies showing leukocytosis, dehydration, SCr likely consistent with a baseline CKD. It is unclear if his mental status is changed from baseline. Started empiric antibiotic coverage with vancomycin and cefepime and consult hospitalist service for admission.    Fuller Planhristopher W Jackie Russman, MD 04/13/16 1549  Blane OharaJoshua Zavitz, MD 04/14/16 [phone]

## 2016-04-13 NOTE — ED Notes (Signed)
Medical records requested from [facility]

## 2016-04-13 NOTE — ED Notes (Addendum)
[age] yom presents from [facility] via EMS with c/c cough. Per EMS, facility states was dc'd from [facility] 4/21 with Dx Pneumonia but concerned pt has continued with productive cough. Denies fever.  Baseline mental status unchanged.   PMHx: dementia, dysphagia, encephalopathy 2/2 alcohol disorder, major depressive disorder, CKD

## 2016-04-13 NOTE — Evaluation (Signed)
Clinical/Bedside Swallow Evaluation Patient Details  Name: Jerry [name] MRN: [id number] Date of Birth: December 10, 1926  Today's Date: 04/13/2016 Time: SLP Start Time (ACUTE ONLY): 1505 SLP Stop Time (ACUTE ONLY): 1555 SLP Time Calculation (min) (ACUTE ONLY): 50 min  Past Medical History:  Past Medical History  Diagnosis Date  . Ischemic heart disease     on plavix  . Encephalopathy   . Hypertension   . Alcohol dependence with alcohol-induced persisting dementia (HCC)   . Dysphagia   . GAD (generalized anxiety disorder)   . CKD (chronic kidney disease)   . MDD (major depressive disorder) (HCC)   . Hyperlipemia   . Cognitive communication deficit   . GERD (gastroesophageal reflux disease)   . Hypothyroidism   . Anemia   . Dementia   . Delusional disorder (HCC)   . Seizures (HCC)     on Valproic acid  . Coronary artery disease   . Former smoker, stopped smoking in distant past    Past Surgical History: History reviewed. No pertinent past surgical history. HPI:  Jerry [name] is a 82 y/o man brought to ED from nursing home where he was noted to have worsening cough and increased sputum production. These symptoms were progressive over the past week. He has not been clearly febrile during the interval. His mental status is questionably worsened with incoherent speech since this morning, however he has severe dementia at baseline. After arrival in the ED he is noted to be mildly hypoxic with O2 saturation <88% that improves with supplemental oxygen. Heart rate mildly tachycardic in Afib which is not documented in nursing home records PTA. He was recently discharged from [facility] on 4/21 with dx of PNA.   Assessment / Plan / Recommendation Clinical Impression  Pt was evaluated at bedside presenting with moderate oropharyngeal dysphagia. Oral mechanism exam revealed dried copious secretions on his upper and lower labial surfaces, edentulous status thick secretions in oral  cavity, and a yellow coating on the entire lingual surface. Secretions were cleared with oral care but lingual coating was unable to be cleared despite consistent clearance attempts with toothettes; state of lingual surface reported to nursing. Note congested sounding cough and persistent coughing prior to any PO trials. During thin trials, the pt demonstrated inconsistent wet vocal quality and delayed congested coughing. Pt demonstrated no wet vocal quality with solids presented, although he did demonstrate delayed coughing. Pt consumed NTL with worsening coughing episodes and increased sounds of congestion. Suspect delayed swallow trigger with all consistencies and increased residuals with NTL, therefore, Recommend thin liquids/D2 (ground); thin liquids to be administered by cup only (NO STRAWS). Furthermore recommend MBS to objectively assess the swallow function and determine least restrictive diet d/t inconsistent s/sx observed and multiple noted risk factors including compromised respiratory status and Chest X-ray showing hazy infiltrate in right base suspicious for pneumonia.    Aspiration Risk  Moderate aspiration risk    Diet Recommendation Dysphagia 2 (Fine chop);Thin liquid   Medication Administration: Crushed with puree Supervision: Full supervision/cueing for compensatory strategies Compensations: Slow rate;Small sips/bites;Multiple dry swallows after each bite/sip;Follow solids with liquid;Effortful swallow Postural Changes: Seated upright at 90 degrees    Other  Recommendations Oral Care Recommendations: Oral care BID   Follow up Recommendations  Skilled Nursing facility    Frequency and Duration min 2x/week  2 weeks       Prognosis Prognosis for Safe Diet Advancement: Fair Barriers to Reach Goals: Cognitive deficits      Swallow Study  General Date of Onset: 04/13/16 HPI: Jerry [name] is a 82 y/o man brought to ED from nursing home where he was noted to have  worsening cough and increased sputum production. These symptoms were progressive over the past week. He has not been clearly febrile during the interval. His mental status is questionably worsened with incoherent speech since this morning, however he has severe dementia at baseline. After arrival in the ED he is noted to be mildly hypoxic with O2 saturation <88% that improves with supplemental oxygen. Heart rate mildly tachycardic in Afib which is not documented in nursing home records PTA. [facility] was [facility] on 4/21 with dx of PNA. Type of Study: Bedside Swallow Evaluation Previous Swallow Assessment: none on record Diet Prior to this Study: NPO Temperature Spikes Noted: No Behavior/Cognition: Alert;Cooperative;Confused;Requires cueing;Agitated Oral Cavity Assessment: Dry;Dried secretions Oral Care Completed by SLP: Yes Oral Cavity - Dentition: Edentulous (pt reports he has dentures) Vision: Functional for self-feeding Self-Feeding Abilities: Needs assist;Total assist Patient Positioning: Upright in bed Baseline Vocal Quality: Wet Volitional Cough: Strong Volitional Swallow: Able to elicit    Oral/Motor/Sensory Function Overall Oral Motor/Sensory Function: Within functional limits   Ice Chips Ice chips: Impaired Presentation: Spoon Oral Phase Impairments: Reduced labial seal Pharyngeal Phase Impairments: Suspected delayed Swallow;Multiple swallows;Cough - Delayed   Thin Liquid Thin Liquid: Impaired Presentation: Cup;Straw Oral Phase Impairments: Reduced labial seal;Poor awareness of bolus Oral Phase Functional Implications: Right anterior spillage Pharyngeal  Phase Impairments: Suspected delayed Swallow;Multiple swallows;Cough - Immediate;Wet Vocal Quality    Nectar Thick Nectar Thick Liquid: Impaired Presentation: Cup Oral Phase Impairments: Reduced labial seal Pharyngeal Phase Impairments: Multiple swallows;Suspected delayed Swallow;Cough - Delayed;Wet  Vocal Quality   Honey Thick Honey Thick Liquid: Not tested   Puree Puree: Impaired Presentation: Spoon Oral Phase Impairments: Poor awareness of bolus;Reduced lingual movement/coordination Oral Phase Functional Implications: Oral residue;Prolonged oral transit Pharyngeal Phase Impairments: Multiple swallows;Cough - Delayed   Solid      Jerry Fornes H. Romie LeveeYarbrough MA, CCC-SLP Speech Language Pathologist  Solid: Impaired Oral Phase Impairments: Poor awareness of bolus;Reduced lingual movement/coordination Oral Phase Functional Implications: Oral residue;Prolonged oral transit Pharyngeal Phase Impairments: Multiple swallows        Jerry [name] 04/13/2016,4:31 PM

## 2016-04-14 ENCOUNTER — Encounter (HOSPITAL_COMMUNITY): Payer: Self-pay | Admitting: *Deleted

## 2016-04-14 ENCOUNTER — Inpatient Hospital Stay (HOSPITAL_COMMUNITY): Payer: Medicare Other

## 2016-04-14 DIAGNOSIS — J189 Pneumonia, unspecified organism: Secondary | ICD-10-CM

## 2016-04-14 DIAGNOSIS — Z66 Do not resuscitate: Secondary | ICD-10-CM

## 2016-04-14 DIAGNOSIS — Z515 Encounter for palliative care: Secondary | ICD-10-CM | POA: Diagnosis present

## 2016-04-14 DIAGNOSIS — R1310 Dysphagia, unspecified: Secondary | ICD-10-CM

## 2016-04-14 DIAGNOSIS — N179 Acute kidney failure, unspecified: Secondary | ICD-10-CM

## 2016-04-14 DIAGNOSIS — I480 Paroxysmal atrial fibrillation: Secondary | ICD-10-CM | POA: Diagnosis present

## 2016-04-14 DIAGNOSIS — R079 Chest pain, unspecified: Secondary | ICD-10-CM

## 2016-04-14 DIAGNOSIS — G9340 Encephalopathy, unspecified: Secondary | ICD-10-CM | POA: Diagnosis present

## 2016-04-14 DIAGNOSIS — E870 Hyperosmolality and hypernatremia: Secondary | ICD-10-CM

## 2016-04-14 DIAGNOSIS — R7989 Other specified abnormal findings of blood chemistry: Secondary | ICD-10-CM

## 2016-04-14 LAB — BASIC METABOLIC PANEL
ANION GAP: 7 (ref 5–15)
BUN: 25 mg/dL — ABNORMAL HIGH (ref 6–20)
CHLORIDE: 110 mmol/L (ref 101–111)
CO2: 29 mmol/L (ref 22–32)
Calcium: 8.3 mg/dL — ABNORMAL LOW (ref 8.9–10.3)
Creatinine, Ser: 1.06 mg/dL (ref 0.61–1.24)
GFR calc non Af Amer: >60 mL/min (ref >60)
Glucose, Bld: 96 mg/dL (ref 65–99)
POTASSIUM: 3.4 mmol/L — AB (ref 3.5–5.1)
Sodium: 146 mmol/L — ABNORMAL HIGH (ref 135–145)

## 2016-04-14 LAB — CBC
HCT: 28.7 % — ABNORMAL LOW (ref 39.0–52.0)
HEMOGLOBIN: 9.3 g/dL — AB (ref 13.0–17.0)
MCH: 29.8 pg (ref 26.0–34.0)
MCHC: 32.4 g/dL (ref 30.0–36.0)
MCV: 92 fL (ref 78.0–100.0)
Platelets: 222 10*3/uL (ref 150–400)
RBC: 3.12 MIL/uL — AB (ref 4.22–5.81)
RDW: 14.8 % (ref 11.5–15.5)
WBC: 8.7 10*3/uL (ref 4.0–10.5)

## 2016-04-14 LAB — TROPONIN I: Troponin I: 0.32 ng/mL — ABNORMAL HIGH (ref <0.031)

## 2016-04-14 MED ORDER — POTASSIUM CHLORIDE CRYS ER 20 MEQ PO TBCR
20.0000 meq | EXTENDED_RELEASE_TABLET | Freq: Every day | ORAL | Status: AC
  Administered 2016-04-14 – 2016-04-16 (×3): 20 meq via ORAL
  Filled 2016-04-14 (×3): qty 1

## 2016-04-14 MED ORDER — LEVOTHYROXINE SODIUM 112 MCG PO TABS
112.0000 ug | ORAL_TABLET | Freq: Every day | ORAL | Status: DC
  Administered 2016-04-15 – 2016-04-18 (×4): 112 ug via ORAL
  Filled 2016-04-14 (×4): qty 1

## 2016-04-14 MED ORDER — VALPROATE SODIUM 250 MG/5ML PO SOLN
250.0000 mg | Freq: Three times a day (TID) | ORAL | Status: DC
  Administered 2016-04-14 – 2016-04-18 (×10): 250 mg via ORAL
  Filled 2016-04-14 (×15): qty 5

## 2016-04-14 MED ORDER — POTASSIUM CHLORIDE IN NACL 20-0.45 MEQ/L-% IV SOLN
INTRAVENOUS | Status: DC
  Administered 2016-04-15 – 2016-04-17 (×3): via INTRAVENOUS
  Filled 2016-04-14 (×9): qty 1000

## 2016-04-14 NOTE — Care Management Important Message (Signed)
Important Message  Patient Details  Name: Jerry [name] MRN: [id number] Date of Birth: 03/08/1926   Medicare Important Message Given:  Yes    [name], [name], RN 04/14/2016, 1:49 PM

## 2016-04-14 NOTE — Plan of Care (Signed)
[name] is resting quietly in bed. He is able to tell me his name but otherwise is not able to have any meaningful conversation.  Call to his sister-in-law Jerry [name]. She shares that she has been the main caregiver for [name] over the last several years. The nursing home calls her for decision-making. [name] has 2 sisters, ages [id number] and [id number], [place] in [place].  She shares that they are in agreement for Jerry to make health care decisions (but no HCC POA). We talk about aspiration pneumonia, and Jerry BougieBelinda shares that [name] has had swallowing issues for a long time, a year and a half now, and is on a special diet. She goes on to tell me that before he became incapacitated he signed a DNR form. She shares that the goal is to not prolong life, that due to his declining mental capacity they do not want extraordinary measures. Jerry BougieBelinda shares that sisters are planning to come next week with the goal to be here next Thursday. Jerry agrees to talk with me via phone tomorrow at 10 AM for more in-depth goals of care discussion.

## 2016-04-14 NOTE — Progress Notes (Signed)
PROGRESS NOTE    Jerry [name]  ZOX:[id number] DOB: Jun 19, 1926 DOA: 04/13/2016 PCP: Audree Bane, DO  Outpatient Specialists:    Brief Narrative:  Patient is an 82 year old man with a history of ischemic heart disease, seizure disorder, hypertension, chronic kidney disease, and dementia, presumed to be from previous alcoholism. He was admitted to the hospital 04/13/2016 from a local skilled nursing facility with a complaint of cough and lethargy. In the ED, chest x-ray revealed right lower lobe pneumonia. His lab data were also significant for a WBC of 14.4, sodium of 147, creatinine of 1.3, and troponin of 0.47. He was admitted for treatment of acute encephalopathy and HCAP.   Assessment & Plan:   Principal Problem:   Acute encephalopathy Active Problems:   HCAP (healthcare-associated pneumonia)   Elevated troponin   Aspiration pneumonia (HCC)   PAF (paroxysmal atrial fibrillation) (HCC)   Dementia   AKI (acute kidney injury) (HCC)   Hypernatremia   Leukocytosis   Palliative care encounter   DNR (do not resuscitate)   Dysphagia   1. Healthcare acquired and possible aspiration pneumonia. Patient was started on Unasyn for possible aspiration pneumonia in a healthcare setting. The infiltrate is on the right side and given his dementia, aspiration was suspected. Speech therapy was consulted and bedside swallow evaluation yielded dysphagia. -We'll continue current antibiotic.  Acute respiratory failure with hypoxia, secondary to pneumonia. Patient's oxygen saturation was 88% on room air in the ED. He was started on oxygen.  Elevated troponin I secondary to non-ST elevation MI versus demand ischemia from infection and atrial fibrillation. Patient's troponin I was 0.47 on admission. His EKG revealed atrial fibrillation without concerning ST elevations. -Patient's troponin I is trending downward. 2-D echocardiogram was ordered and is pending. -He is treated chronically with Plavix and  isosorbide dinitrate; they were continued. -We will add low-dose beta blocker. -Given his chronic debilitation and dementia, he is not a candidate for invasive diagnostic workup, but if this 2-D echocardiogram results are concerning, will consider cardiology consult.  ? New onset PAF -Patient's EKG on admission and follow-up EKG 04/14/16 reveals atrial fibrillation. There appears to be no known recorded history of atrial fibrillation. His rate is controlled. -We'll continue Plavix. -We'll add low-dose metoprolol twice a day. -2-D echocardiogram ordered and is pending.  Acute kidney injury. Patient's creatinine was 1.31 on admission. He was started on IV fluids. His creatinine has improved. Etiology of his acute renal injury was secondary to prerenal azotemia and dehydration.  Hypernatremia. Patient's serum sodium was 147 on admission. He was started on half-normal saline. There is slight improvement in his serum sodium. We'll continue half normal saline for now. Hypokalemia. The patient's serum potassium was within normal limits on admission, but has fallen slightly with hypotonic IV fluids. -We'll start oral potassium as tolerated and add potassium to the IV fluids.  Advanced dementia with a history of behavioral disturbance. Per chart review, the patient has a history of alcoholism which is mentioned as the etiology of his dementia. He appears to be treated chronically with Risperdal and valproic acid and Lexapro and clonazepam. All were restarted. -We'll need to clarify home medications regarding his dementia. -He has demonstrated no behavioral disturbance so far.  Acute encephalopathy with lethargy on admission. Etiology multifactorial, but primarily secondary to infection. We'll continue to monitor and provide supportive treatment.  Hypothyroidism. Synthroid was restarted. We'll restart Synthroid. Will order TSH.  Dysphagia. The speech therapist evaluated the patient at the  bedside and it yielded moderate  dysphagia. His diet was downgraded to dysphagia 2 with nectar thick liquids. -MBS ordered and is pending.  DO NOT RESUSCITATE status. Patient is chronically demented and debilitated. Palliative care consulted.     DVT prophylaxis: Lovenox Code Status: DO NOT RESUSCITATE Family Communication: Family not available Disposition Plan: Discharge back to SNF when clinically appropriate, likely in a few days.   Consultants:   Palliative care  Procedures:   Echo  Antimicrobials:   Unasyn 04/13/16>>   Subjective: Patient is sitting up in the bed. He denies chest pain or shortness of breath. He is very hard of hearing.  Objective: Filed Vitals:   04/13/16 1442 04/13/16 1948 04/13/16 2039 04/14/16 0550  BP: 125/67  124/62 136/83  Pulse: 82  88 87  Temp: 97.9 F (36.6 C)  97.7 F (36.5 C) 97.9 F (36.6 C)  TempSrc: Oral  Oral Oral  Resp: Height:      Weight: 81.7 kg (180 lb 1.9 oz)     SpO2: 94% 95% 98% 96%    Intake/Output Summary (Last 24 hours) at 04/14/16 1827 Last data filed at 04/14/16 1023  Gross per 24 hour  Intake    340 ml  Output      0 ml  Net    340 ml   Filed Weights   04/13/16 0958 04/13/16 1442  Weight: 88.451 kg (195 lb) 81.7 kg (180 lb 1.9 oz)    Examination:  General exam: Appears calm and comfortable; no acute distress but appears chronically debilitated  Respiratory system: Lungs with right mid lobe fine crackles. Respiratory effort normal. Cardiovascular system: Irregular, irregular No pedal edema. Gastrointestinal system: Abdomen is nondistended, soft and nontender. No organomegaly or masses felt. Normal bowel sounds heard. Central nervous system: He is alert, but not oriented to time place or year. He is very hard of hearing. Extremities: Symmetric bilateral handgrip 5 over 5. Skin: No rashes, lesions or ulcers Psychiatry: Flat affect and confused.    Data Reviewed: I have personally reviewed  following labs and imaging studies  CBC:  Recent Labs Lab 04/13/16 1030 04/14/16 0511  WBC 14.4* 8.7  NEUTROABS 11.8*  --   HGB 8.9* 9.3*  HCT 27.6* 28.7*  MCV 90.8 92.0  PLT 299 222   Basic Metabolic Panel:  Recent Labs Lab 04/13/16 1030 04/14/16 0511  NA 147* 146*  K 3.6 3.4*  CL 109 110  CO2 29 29  GLUCOSE 129* 96  BUN 27* 25*  CREATININE 1.31* 1.06  CALCIUM 8.9 8.3*   GFR: Estimated Creatinine Clearance: 52.3 mL/min (by C-G formula based on Cr of 1.06). Liver Function Tests:  Recent Labs Lab 04/13/16 1030  AST 23  ALT 21  ALKPHOS 47  BILITOT 0.6  PROT 7.0  ALBUMIN 2.9*   No results for input(s): LIPASE, AMYLASE in the last 168 hours. No results for input(s): AMMONIA in the last 168 hours. Coagulation Profile:  Recent Labs Lab 04/13/16 1030  INR 1.35   Cardiac Enzymes:  Recent Labs Lab 04/13/16 1030 04/13/16 1714 04/13/16 2248 04/14/16 0511  TROPONINI 0.47* 0.41* 0.36* 0.32*   BNP (last 3 results) No results for input(s): PROBNP in the last 8760 hours. HbA1C: No results for input(s): HGBA1C in the last 72 hours. CBG: No results for input(s): GLUCAP in the last 168 hours. Lipid Profile: No results for input(s): CHOL, HDL, LDLCALC, TRIG, CHOLHDL, LDLDIRECT in the last 72 hours. Thyroid Function Tests: No results for input(s): TSH, T4TOTAL, FREET4,  T3FREE, THYROIDAB in the last 72 hours. Anemia Panel: No results for input(s): VITAMINB12, FOLATE, FERRITIN, TIBC, IRON, RETICCTPCT in the last 72 hours. Urine analysis: No results found for: COLORURINE, APPEARANCEUR, LABSPEC, PHURINE, GLUCOSEU, HGBUR, BILIRUBINUR, KETONESUR, PROTEINUR, UROBILINOGEN, NITRITE, LEUKOCYTESUR Sepsis Labs: (procalcitonin:4,lacticidven:4)  ) Recent Results (from the past 240 hour(s))  MRSA PCR Screening     Status: None   Collection Time: 04/13/16  4:20 PM  Result Value Ref Range Status   MRSA by PCR NEGATIVE NEGATIVE Final    Comment:        The  GeneXpert MRSA Assay (FDA approved for NASAL specimens only), is one component of a comprehensive MRSA colonization surveillance program. It is not intended to diagnose MRSA infection nor to guide or monitor treatment for MRSA infections.          Radiology Studies: Dg Chest Portable 1 View  04/13/2016  CLINICAL DATA:  Cough, possible pneumonia EXAM: PORTABLE CHEST 1 VIEW COMPARISON:  None. FINDINGS: Cardiomediastinal silhouette is unremarkable. There is hazy infiltrate in right base suspicious for pneumonia. No pulmonary edema. Mild left basilar atelectasis or infiltrate. IMPRESSION: There is hazy infiltrate in right base suspicious for pneumonia. No pulmonary edema. Mild left basilar atelectasis or infiltrate. Followup PA and lateral chest X-ray is recommended in 3-4 weeks following trial of antibiotic therapy to ensure resolution and exclude underlying malignancy. Electronically Signed   By: Natasha Mead M.D.   On: 04/13/2016 10:43   Dg Swallowing Func-speech Pathology  04/14/2016  Objective Swallowing Evaluation: Type of Study: MBS-Modified Barium Swallow Study Patient Details Name: Jerry [name] MRN: [id number] Date of Birth: [birth year] Today's Date: 04/14/2016 Time: SLP Start Time (ACUTE ONLY): 1434-SLP Stop Time (ACUTE ONLY): 1451 SLP Time Calculation (min) (ACUTE ONLY): 17 min Past Medical History: Past Medical History Diagnosis Date . Ischemic heart disease    on plavix . Encephalopathy  . Hypertension  . Alcohol dependence with alcohol-induced persisting dementia (HCC)  . Dysphagia  . GAD (generalized anxiety disorder)  . CKD (chronic kidney disease)  . MDD (major depressive disorder) (HCC)  . Hyperlipemia  . Cognitive communication deficit  . GERD (gastroesophageal reflux disease)  . Hypothyroidism  . Anemia  . Dementia  . Delusional disorder (HCC)  . Seizures (HCC)    on Valproic acid . Coronary artery disease  . Former smoker, stopped smoking in distant past  Past Surgical History: No  past surgical history on file. HPI: [name] is a 82 y/o man brought to ED from nursing home where he was noted to have worsening cough and increased sputum production. These symptoms were progressive over the past week. He has not been clearly febrile during the interval. His mental status is questionably worsened with incoherent speech since this morning, however he has severe dementia at baseline. After arrival in the ED he is noted to be mildly hypoxic with O2 saturation <88% that improves with supplemental oxygen. Heart rate mildly tachycardic in Afib which is not documented in nursing home records PTA. He was recently discharged from [facility] on 4/21 with dx of PNA. Chest X-Ray reveals "There is hazy infiltrate in right base suspicious for pneumonia." Assessment / Plan / Recommendation CHL IP CLINICAL IMPRESSIONS 04/14/2016 Therapy Diagnosis Mild oral phase dysphagia;Moderate oral phase dysphagia;Mild pharyngeal phase dysphagia;Moderate pharyngeal phase dysphagia Clinical Impression Pt presents with mild/moderate oropharyngeal dysphagia of unknown etiology. Pt consumed thin liquids via cup and straw demonstrating uncoordinated bolus cohesion, delayed oral transit with poor awareness of bolus resulting in  premature spillage and a delayed swallow that was initiated at the level of the pyriform sinuses, Inconsistent NOTED ASPIRATION before and during the swallow that was sensed but not cleared despite strong reflexive cough. Decreased base of tongue retration resulting in valleculae residue with thin and nectar trials. Nectar trials yielded uncoordinated oral stage and a gross amount of premature spillage with delayed swallow initiation to the level of the pyriform sinuses and no penetration or aspiration although mild vallecular residue was noted but cleared with reflexive and voluntary (verbal cues to provide) second swallow. Pt consumed puree and mechanical soft textures demonstrating  uncoordinated bolus cohesion, some piecemeal deglutition, swallow initiation at the level of the valleculae and mild valleculae residue after the swallow cleared with reflexive second swallow. Recommend D2 (ground)/Nectar Thick Liquids and meds to be administered crushed in puree.  Impact on safety and function Moderate aspiration risk   CHL IP TREATMENT RECOMMENDATION 04/14/2016 Treatment Recommendations Therapy as outlined in treatment plan below   Prognosis 04/14/2016 Prognosis for Safe Diet Advancement Fair Barriers to Reach Goals Cognitive deficits Barriers/Prognosis Comment -- CHL IP DIET RECOMMENDATION 04/14/2016 SLP Diet Recommendations Dysphagia 2 (Fine chop) solids;Nectar thick liquid Liquid Administration via Cup;Straw Medication Administration Crushed with puree Compensations Slow rate;Small sips/bites;Follow solids with liquid Postural Changes Seated upright at 90 degrees   CHL IP OTHER RECOMMENDATIONS 04/14/2016 Recommended Consults -- Oral Care Recommendations Oral care BID Other Recommendations Order thickener from pharmacy   CHL IP FOLLOW UP RECOMMENDATIONS 04/14/2016 Follow up Recommendations [facility] D[facility] 04/14/2016 Speech Therapy Frequency (ACUTE ONLY) min 2x/week Treatment Duration 2 weeks      CHL IP ORAL PHASE 04/14/2016 Oral Phase Impaired Oral - Pudding Teaspoon -- Oral - Pudding Cup -- Oral - Honey Teaspoon -- Oral - Honey Cup -- Oral - Nectar Teaspoon -- Oral - Nectar Cup Holding of bolus;Delayed oral transit;Decreased bolus cohesion;Premature spillage Oral - Nectar Straw Premature spillage;Decreased bolus cohesion;Delayed oral transit;Holding of bolus Oral - Thin Teaspoon -- Oral - Thin Cup Premature spillage;Decreased bolus cohesion;Delayed oral transit;Holding of bolus Oral - Thin Straw Premature spillage;Decreased bolus cohesion;Delayed oral transit;Holding of bolus Oral - Puree Delayed oral transit;Premature spillage;Decreased bolus cohesion Oral -  Mech Soft Premature spillage;Decreased bolus cohesion;Delayed oral transit;Impaired mastication;Weak lingual manipulation Oral - Regular -- Oral - Multi-Consistency -- Oral - Pill -- Oral Phase - Comment --  CHL IP PHARYNGEAL PHASE 04/14/2016 Pharyngeal Phase Impaired Pharyngeal- Pudding Teaspoon -- Pharyngeal -- Pharyngeal- Pudding Cup -- Pharyngeal -- Pharyngeal- Honey Teaspoon -- Pharyngeal -- Pharyngeal- Honey Cup -- Pharyngeal -- Pharyngeal- Nectar Teaspoon -- Pharyngeal -- Pharyngeal- Nectar Cup Delayed swallow initiation-pyriform sinuses;Pharyngeal residue - valleculae;Reduced tongue base retraction Pharyngeal -- Pharyngeal- Nectar Straw Delayed swallow initiation-pyriform sinuses;Pharyngeal residue - valleculae;Reduced tongue base retraction Pharyngeal -- Pharyngeal- Thin Teaspoon -- Pharyngeal -- Pharyngeal- Thin Cup Delayed swallow initiation-pyriform sinuses;Pharyngeal residue - valleculae;Penetration/Aspiration before swallow;Penetration/Aspiration during swallow;Reduced tongue base retraction;Reduced airway/laryngeal closure Pharyngeal Material enters airway, passes BELOW cords and not ejected out despite cough attempt by patient Pharyngeal- Thin Straw Delayed swallow initiation-pyriform sinuses;Penetration/Aspiration before swallow;Penetration/Aspiration during swallow;Reduced airway/laryngeal closure;Pharyngeal residue - valleculae Pharyngeal Material enters airway, passes BELOW cords and not ejected out despite cough attempt by patient Pharyngeal- Puree Delayed swallow initiation-vallecula;Pharyngeal residue - valleculae Pharyngeal -- Pharyngeal- Mechanical Soft Delayed swallow initiation-vallecula Pharyngeal -- Pharyngeal- Regular -- Pharyngeal -- Pharyngeal- Multi-consistency -- Pharyngeal -- Pharyngeal- Pill -- Pharyngeal -- Pharyngeal Comment --  Amelia H. Romie LeveeYarbrough MA, CCC-SLP Speech Language Pathologist Rayann HemanAmelia H  Myer Haff 04/14/2016, 3:36 PM                   Scheduled Meds: .  ampicillin-sulbactam (UNASYN) IV  3 g Intravenous Q8H  . clonazePAM  0.25 mg Oral QHS  . clopidogrel  75 mg Oral Daily  . enoxaparin (LOVENOX) injection  40 mg Subcutaneous Q24H  . escitalopram  10 mg Oral Daily  . isosorbide dinitrate  30 mg Oral Daily  . multivitamin with minerals  1 tablet Oral Daily  . risperiDONE  2 mg Oral BID  . Valproate Sodium  250 mg Oral TID   Continuous Infusions: . sodium chloride 75 mL/hr at 04/14/16 1022     LOS: 1 day    Time spent: 40 minutes    Elliot Cousin, MD [facility] Pager [phone]  If 7PM-7AM, please contact night-coverage [email or web address] Password [facility] 04/14/2016, 6:27 PM

## 2016-04-14 NOTE — Progress Notes (Signed)
Pt took all medications, except for valproic acid which cannot be crushed.

## 2016-04-14 NOTE — Evaluation (Signed)
Objective Swallowing Evaluation: Type of Study: MBS-Modified Barium Swallow Study  Patient Details  Name: Jerry [name] MRN: [id number] Date of Birth: 04/29/1926  Today's Date: 04/14/2016 Time: SLP Start Time (ACUTE ONLY): 1434-SLP Stop Time (ACUTE ONLY): 1451 SLP Time Calculation (min) (ACUTE ONLY): 17 min  Past Medical History:  Past Medical History  Diagnosis Date  . Ischemic heart disease     on plavix  . Encephalopathy   . Hypertension   . Alcohol dependence with alcohol-induced persisting dementia (HCC)   . Dysphagia   . GAD (generalized anxiety disorder)   . CKD (chronic kidney disease)   . MDD (major depressive disorder) (HCC)   . Hyperlipemia   . Cognitive communication deficit   . GERD (gastroesophageal reflux disease)   . Hypothyroidism   . Anemia   . Dementia   . Delusional disorder (HCC)   . Seizures (HCC)     on Valproic acid  . Coronary artery disease   . Former smoker, stopped smoking in distant past    Past Surgical History: No past surgical history on file.  HPI: Jerry [name] is a 82 y/o man brought to ED from nursing home where he was noted to have worsening cough and increased sputum production. These symptoms were progressive over the past week. He has not been clearly febrile during the interval. His mental status is questionably worsened with incoherent speech since this morning, however he has severe dementia at baseline. After arrival in the ED he is noted to be mildly hypoxic with O2 saturation <88% that improves with supplemental oxygen. Heart rate mildly tachycardic in Afib which is not documented in nursing home records PTA. [facility] with dx of PNA. Chest X-Ray reveals "There is hazy infiltrate in right base suspicious for pneumonia."   Assessment / Plan / Recommendation  CHL IP CLINICAL IMPRESSIONS 04/14/2016  Therapy Diagnosis Mild oral phase dysphagia;Moderate oral phase dysphagia;Mild  pharyngeal phase dysphagia;Moderate pharyngeal phase dysphagia  Clinical Impression Pt presents with mild/moderate oropharyngeal dysphagia of unknown etiology. Pt consumed thin liquids via cup and straw demonstrating uncoordinated bolus cohesion, delayed oral transit with poor awareness of bolus resulting in premature spillage and a delayed swallow that was initiated at the level of the pyriform sinuses, Inconsistent NOTED ASPIRATION before and during the swallow that was sensed but not cleared despite strong reflexive cough. Decreased base of tongue retration resulting in valleculae residue with thin and nectar trials. Nectar trials yielded uncoordinated oral stage and a gross amount of premature spillage with delayed swallow initiation to the level of the pyriform sinuses and no penetration or aspiration although mild vallecular residue was noted but cleared with reflexive and voluntary (verbal cues to provide) second swallow. Pt consumed puree and mechanical soft textures demonstrating uncoordinated bolus cohesion, some piecemeal deglutition, swallow initiation at the level of the valleculae and mild valleculae residue after the swallow cleared with reflexive second swallow. Recommend D2 (ground)/Nectar Thick Liquids and meds to be administered crushed in puree.   Impact on safety and function Moderate aspiration risk   See Imaging Section for full report.    Amelia H. Romie LeveeYarbrough MA, CCC-SLP Speech Language Pathologist

## 2016-04-14 NOTE — Clinical Social Work Note (Signed)
Clinical Social Work Assessment  Patient Details  Name: Jerry [name] MRN: [id number] Date of Birth: 09/02/1926  Date of referral:  04/14/16               Reason for consult:  Other (Comment Required) ([facility])                Permission sought to share information with:    Permission granted to share information::     Name::        Agency::     Relationship::     Contact Information:     Housing/Transportation Living arrangements for the past 2 months:  Skilled Nursing Facility Source of Information:  Other (Comment Required), Facility (Sister In EdgarLaw, [facility]) Patient Interpreter Needed:  None Criminal Activity/Legal Involvement Pertinent to Current Situation/Hospitalization:  No - Comment as needed Significant Relationships:  Other Family Members (sisters, sister in law, nephew) Lives with:  Facility Resident Do you feel safe going back to the place where you live?  Yes Need for family participation in patient care:  Yes (Comment)  Care giving concerns: Facility resident.   Social Worker assessment / plan: CSW spoke with patient's sister-in-law, Belinda Arambula. She advised that [facility] has [facility].  She stated that patient was ambulating unassisted that last time she was able to visit him about eight months ago. She stated that staff assists patient with ADLs.  She advised that it is her desire for patient to return to the facility at discharge.  Ms. [name] indicated that she is patient's nearest relative and she lives 3-4 hours away. She stated that patient has two sisters (one lives in [place], [place] and another lives [place], [place]  She advised that patient was [facility]. CSW spoke with Reather Conversearoline Markham, who confirmed Ms. Pharris's statements.  She added that patient was working with PT to bring him back to baseline and uses a walker  currently.  Rayfield CitizenCaroline advised that patient is in the memory care unit. She stated that patient can return to the facility.   Employment status:  Retired Health and safety inspectornsurance information:  Medicare PT Recommendations:  Not assessed at this time Information / Referral to community resources:     Patient/Family's Response to care:  Family is agreeable for patient to return to [facility].  Patient/Family's Understanding of and Emotional Response to Diagnosis, Current Treatment, and Prognosis: Family is aware and understands patient's diagnosis, treatment and prognosis.   Emotional Assessment Appearance:  Developmentally appropriate Attitude/Demeanor/Rapport:  Unable to Assess Affect (typically observed):  Unable to Assess Orientation:    Alcohol / Substance use:  Not Applicable Psych involvement (Current and /or in the community):  No (Comment)  Discharge Needs  Concerns to be addressed:   (Return to facility) Readmission within the last 30 days:   ([facility] Current discharge risk:  None Barriers to Discharge:  No Barriers Identified   Annice NeedySettle, Antoinette Haskett D, LCSW 04/14/2016, 10:47 AM

## 2016-04-14 NOTE — [place] FL2 (Signed)
[place] MEDICAID FL2 LEVEL OF CARE SCREENING TOOL     IDENTIFICATION  Patient Name: Jerry HahnWillis Ige Birthdate: 11/19/1926 Sex: male Admission Date (Current Location): 04/13/2016  [facility] [place]  [place]   Facility and Address:  [facility],  [address], [place] [id number]      Provider Number: [id number]  Attending Physician Name and Address:  Elliot Cousinenise Coleman Kalas, MD  Relative Name and Phone Number:       Current Level of Care: Hospital Recommended Level of Care: Skilled Nursing Facility Prior Approval Number:    Date Approved/Denied:   PASRR Number:    Discharge Plan: SNF    Current Diagnoses: Patient Active Problem List   Diagnosis Date Noted  . HCAP (healthcare-associated pneumonia) 04/13/2016  . Dementia 04/13/2016  . ARF (acute renal failure) (HCC) 04/13/2016  . Hypernatremia 04/13/2016  . Leukocytosis 04/13/2016  . Elevated troponin 04/13/2016  . Aspiration pneumonia (HCC) 04/13/2016    Orientation RESPIRATION BLADDER Height & Weight     Self  O2 (2L) Incontinent Weight: 180 lb 1.9 oz (81.7 kg) Height:  5\' 11"  (180.3 cm)  BEHAVIORAL SYMPTOMS/MOOD NEUROLOGICAL BOWEL NUTRITION STATUS      Incontinent Diet (Heart Healthy/Carb Modified)  AMBULATORY STATUS COMMUNICATION OF NEEDS Skin   Limited Assist Verbally Normal                       Personal Care Assistance Level of Assistance  Bathing, Feeding, Dressing Bathing Assistance: Limited assistance Feeding assistance: Limited assistance Dressing Assistance: Limited assistance     Functional Limitations Info             SPECIAL CARE FACTORS FREQUENCY                       Contractures      Additional Factors Info  Psychotropic     Psychotropic Info:  (Klonopin, Lexapro, Risperdal)         Current Medications (04/14/2016):  This is the current hospital active medication list Current Facility-Administered Medications  Medication Dose Route Frequency  Provider Last Rate Last Dose  . 0.45 % sodium chloride infusion   Intravenous Continuous Henderson CloudEstela Y Hernandez Acosta, MD 75 mL/hr at 04/14/16 1022    . acetaminophen (TYLENOL) tablet 650 mg  650 mg Oral Q6H PRN Henderson CloudEstela Y Hernandez Acosta, MD       Or  . acetaminophen (TYLENOL) suppository 650 mg  650 mg Rectal Q6H PRN Henderson CloudEstela Y Hernandez Acosta, MD      . Ampicillin-Sulbactam (UNASYN) 3 g in sodium chloride 0.9 % 100 mL IVPB  3 g Intravenous Q8H Estela Isaiah BlakesY Hernandez Acosta, MD   3 g at 04/14/16 1023  . clonazePAM (KLONOPIN) tablet 0.25 mg  0.25 mg Oral QHS Henderson CloudEstela Y Hernandez Acosta, MD   0.25 mg at 04/13/16 2252  . clopidogrel (PLAVIX) tablet 75 mg  75 mg Oral Daily Henderson CloudEstela Y Hernandez Acosta, MD   75 mg at 04/14/16 1028  . enoxaparin (LOVENOX) injection 40 mg  40 mg Subcutaneous Q24H Henderson CloudEstela Y Hernandez Acosta, MD   40 mg at 04/13/16 2253  . escitalopram (LEXAPRO) tablet 10 mg  10 mg Oral Daily Henderson CloudEstela Y Hernandez Acosta, MD   10 mg at 04/14/16 1028  . isosorbide dinitrate (ISORDIL) tablet 30 mg  30 mg Oral Daily Henderson CloudEstela Y Hernandez Acosta, MD   30 mg at 04/14/16 1029  . multivitamin with minerals tablet 1 tablet  1 tablet  Oral Daily Henderson Cloud, MD   1 tablet at 04/14/16 1029  . ondansetron (ZOFRAN) tablet 4 mg  4 mg Oral Q6H PRN Henderson Cloud, MD       Or  . ondansetron [facility]) injection 4 mg  4 mg Intravenous Q6H PRN Henderson Cloud, MD      . risperiDONE (RISPERDAL) tablet 2 mg  2 mg Oral BID Henderson Cloud, MD   2 mg at 04/14/16 1029  . senna-docusate (Senokot-S) tablet 1 tablet  1 tablet Oral QHS PRN Henderson Cloud, MD      . valproic acid (DEPAKENE) 250 MG capsule 250 mg  250 mg Oral TID Henderson Cloud, MD   250 mg at 04/13/16 2253     Discharge Medications: Please see discharge summary for a list of discharge medications.  Relevant Imaging Results:  Relevant Lab Results:   Additional Information    Settle, Juleen China,  LCSW

## 2016-04-14 NOTE — Progress Notes (Signed)
*  PRELIMINARY RESULTS* Echocardiogram 2D Echocardiogram has been performed.  Jeryl Columbialliott, Shann Lewellyn 04/14/2016, 4:32 PM

## 2016-04-15 ENCOUNTER — Encounter (HOSPITAL_COMMUNITY): Payer: Self-pay | Admitting: Primary Care

## 2016-04-15 DIAGNOSIS — I4819 Other persistent atrial fibrillation: Secondary | ICD-10-CM | POA: Diagnosis present

## 2016-04-15 DIAGNOSIS — I481 Persistent atrial fibrillation: Secondary | ICD-10-CM

## 2016-04-15 DIAGNOSIS — J690 Pneumonitis due to inhalation of food and vomit: Principal | ICD-10-CM

## 2016-04-15 DIAGNOSIS — I272 Other secondary pulmonary hypertension: Secondary | ICD-10-CM

## 2016-04-15 LAB — CBC
HCT: 29.8 % — ABNORMAL LOW (ref 39.0–52.0)
HEMOGLOBIN: 9.5 g/dL — AB (ref 13.0–17.0)
MCH: 29.3 pg (ref 26.0–34.0)
MCHC: 31.9 g/dL (ref 30.0–36.0)
MCV: 92 fL (ref 78.0–100.0)
Platelets: 270 10*3/uL (ref 150–400)
RBC: 3.24 MIL/uL — ABNORMAL LOW (ref 4.22–5.81)
RDW: 14.6 % (ref 11.5–15.5)
WBC: 7.7 10*3/uL (ref 4.0–10.5)

## 2016-04-15 LAB — BASIC METABOLIC PANEL
Anion gap: 10 (ref 5–15)
BUN: 22 mg/dL — AB (ref 6–20)
CHLORIDE: 109 mmol/L (ref 101–111)
CO2: 27 mmol/L (ref 22–32)
CREATININE: 0.98 mg/dL (ref 0.61–1.24)
Calcium: 8.5 mg/dL — ABNORMAL LOW (ref 8.9–10.3)
GFR calc Af Amer: >60 mL/min (ref >60)
GFR calc non Af Amer: >60 mL/min (ref >60)
GLUCOSE: 104 mg/dL — AB (ref 65–99)
Potassium: 3.6 mmol/L (ref 3.5–5.1)
Sodium: 146 mmol/L — ABNORMAL HIGH (ref 135–145)

## 2016-04-15 LAB — TSH: TSH: 1.184 u[IU]/mL (ref 0.350–4.500)

## 2016-04-15 LAB — T4, FREE: Free T4: 1.08 ng/dL (ref 0.61–1.12)

## 2016-04-15 LAB — ECHOCARDIOGRAM COMPLETE
Height: 71 in
Weight: 2881.85 oz

## 2016-04-15 MED ORDER — METOPROLOL SUCCINATE ER 25 MG PO TB24
12.5000 mg | ORAL_TABLET | Freq: Every day | ORAL | Status: DC
  Administered 2016-04-16 – 2016-04-18 (×3): 12.5 mg via ORAL
  Filled 2016-04-15 (×3): qty 1

## 2016-04-15 NOTE — Consult Note (Signed)
Consultation Note Date: 04/15/2016   Patient Name: Jerry [name]  DOB: [birth year]  MRN: [id number]  Age / Sex: 82 y.o., male  PCP: Audree Bane, DO Referring Physician: Elliot Cousin, MD  Reason for Consultation: Disposition, Establishing goals of care, Hospice Evaluation and Psychosocial/spiritual support  HPI/Patient Profile: 82 y.o. male  with past medical history of Ischemic heart disease, hypertension, coronary artery disease, alcohol dependence dense with alcohol induced persisting dementia, severe dementia with behavioral disturbances admitted on 04/13/2016 with worsening cough with increased sputum production.   Clinical Assessment and Goals of Care: [name] is sitting in bed with nursing at bedside. They are getting him ready to get up to the [facility] chair and feed breakfast.  He will very briefly make eye contact, and states repeatedly "help me God", and "please Lord". The majority of the time he has unintelligible speech.  Call to sister-in-law, Belinda Konicki, at [phone]. We talk about [name]'s chronic and acute problems. Including the results from his swallow study showing repeated aspiration.  We reviewed labs, and decreasing white blood cells, but that aspiration pneumonia will recur.  We talk about possible discharge to [facility] in the next few days.     [name] shares that he told family and little over a year ago that he "would not see them again because he was not doing good".  She talks about his time in [facility] fighting with staff and not keeping clothes on, she also relays behaviors at [employer] [place].   We talk about dignity, and [name] stating earlier today, repeatedly, "help me God", and "please Lord", but otherwise mostly unintelligible speech.  [name] shares that he has been incontinent for several years now.  I share that he is also unable  to feed himself at this time.   We discuss the concepts of do not treat the next infection, do not rehospitalize. [name] shares that his sisters have told her that they are "praying for God's will" and their desire is that their brother be "made as comfortable as possible".  We discussed the benefits of hospice in the facility for comfort and dignity, especially when [name] experiences repeat aspiration pneumonia. I advise Belinda that I will call her midday tomorrow with another update, but that [name] is expected to discharge in the next few days.  Call to [place], SW at [facility].  She shares the MOST form choices, limited Interventions, antibiotics as indicated, IV fluids as indicated.  She shares that 3 hospice providers come to their facility, [address], [place], and [facility].   Contacts/Participants in Discussion: call to sister-in-law, Belinda Imel, at [phone] Primary Decision Maker/Relationship: sister-in-law, Belinda Winokur   HCPOA: no sister-in-law, Belinda Winograd shares that Mr. Lantigua has 2 sisters living, one age 25 and one age 61, both living out[place]of[place]state, [place] and [place]. [name] shares that they request she make medical decisions, and has done so for several years now.  SUMMARY OF RECOMMENDATIONS   Goal is  for [name] [facility] [place] as custodial care. Massie BougieBelinda shares that the sisters are "praying for God's will" and want their brother "made comfortable as possible".   Massie BougieBelinda shares that she has completed an advanced directives form [name] [place].  We do discuss the concepts of do not treat the next infection, do not rehospitalized, and choices the family has when Mr. [name] has repeat aspiration pneumonia.  No chest compressions, intubation/ventilation, PEG tube.   Code Status/Advance Care Planning:    Code Status Orders        Start     Ordered   04/14/16 1610  Do not  attempt resuscitation (DNR)   Continuous    Question Answer Comment  In the event of cardiac or respiratory ARREST Do not call a "code blue"   In the event of cardiac or respiratory ARREST Do not perform Intubation, CPR, defibrillation or ACLS   In the event of cardiac or respiratory ARREST Use medication by any route, position, wound care, and other measures to relive pain and suffering. May use oxygen, suction and manual treatment of airway obstruction as needed for comfort.      04/14/16 1609    Code Status History    Date Active Date Inactive Code Status Order ID Comments User Context   04/13/2016  4:43 PM 04/14/2016  4:09 PM Full Code [id number]  Henderson CloudEstela Y Hernandez Acosta, MD Inpatient       Other Directives:MOST Form, at [facility] [place], limited interventions.  Symptom Management:   Per hospitalist  Palliative Prophylaxis:   Aspiration, Frequent Pain Assessment and Turn Reposition  Additional Recommendations (Limitations, Scope, Preferences):  Treat the treatable, but no extraordinary measures. family is considering the option for do not treat the next infection, do not rehospitalized.  Psycho-social/Spiritual:   Desire for further Chaplaincy support:no  Additional Recommendations: Caregiving  Support/Resources and Education on Hospice  Prognosis:   < 6 months, likely based on advanced dementia, repeat recurrent aspiration pneumonia is, and cardiac disease burden.  Discharge Planning: Goal is [facility] [name], we have discussed returning with hospice services. No decision at this time.     Primary Diagnoses: Present on Admission:  . HCAP (healthcare-associated pneumonia) . Dementia . AKI (acute kidney injury) (HCC) . Hypernatremia . Leukocytosis . Elevated troponin . Aspiration pneumonia (HCC) . Palliative care encounter . DNR (do not resuscitate) . Acute encephalopathy . PAF (paroxysmal atrial fibrillation) (HCC)  I have  reviewed the medical record, interviewed the patient and family, and examined the patient. The following aspects are pertinent.  Past Medical History  Diagnosis Date  . Ischemic heart disease     on plavix  . Encephalopathy   . Hypertension   . Alcohol dependence with alcohol-induced persisting dementia (HCC)   . Dysphagia   . GAD (generalized anxiety disorder)   . CKD (chronic kidney disease)   . MDD (major depressive disorder) (HCC)   . Hyperlipemia   . Cognitive communication deficit   . GERD (gastroesophageal reflux disease)   . Hypothyroidism   . Anemia   . Dementia   . Delusional disorder (HCC)   . Seizures (HCC)     on Valproic acid  . Coronary artery disease   . Former smoker, stopped smoking in distant past    Social History   Social History  . Marital Status: Widowed    Spouse Name: N/A  . Number of Children: N/A  . Years of Education:  N/A   Social History Main Topics  . Smoking status: Never Smoker   . Smokeless tobacco: None  . Alcohol Use: 0.0 oz/week    0 Standard drinks or equivalent per week     Comment: previous alcohol abuse  . Drug Use: None  . Sexual Activity: Not Asked   Other Topics Concern  . None   Social History Narrative   History reviewed. No pertinent family history. Scheduled Meds: . ampicillin-sulbactam (UNASYN) IV  3 g Intravenous Q8H  . clonazePAM  0.25 mg Oral QHS  . clopidogrel  75 mg Oral Daily  . enoxaparin (LOVENOX) injection  40 mg Subcutaneous Q24H  . escitalopram  10 mg Oral Daily  . isosorbide dinitrate  30 mg Oral Daily  . levothyroxine  112 mcg Oral QAC breakfast  . multivitamin with minerals  1 tablet Oral Daily  . potassium chloride  20 mEq Oral Daily  . risperiDONE  2 mg Oral BID  . Valproate Sodium  250 mg Oral TID   Continuous Infusions: . 0.45 % NaCl with KCl 20 mEq / L 70 mL/hr at 04/15/16 1012   PRN Meds:.acetaminophen **OR** acetaminophen, ondansetron **OR** ondansetron (ZOFRAN) IV,  senna-docusate Medications Prior to Admission:  Prior to Admission medications   Medication Sig Start Date End Date Taking? Authorizing Provider  acetaminophen (TYLENOL) 325 MG tablet Take 650 mg by mouth every 6 (six) hours as needed.   Yes Historical Provider, MD  bisacodyl (DULCOLAX) 10 MG suppository Place 10 mg rectally every 3 (three) days as needed for moderate constipation.   Yes Historical Provider, MD  clonazePAM (KLONOPIN) 0.25 MG disintegrating tablet Take 0.25 mg by mouth at bedtime.   Yes Historical Provider, MD  clopidogrel (PLAVIX) 75 MG tablet Take 75 mg by mouth daily.   Yes Historical Provider, MD  escitalopram (LEXAPRO) 10 MG tablet Take 10 mg by mouth daily.   Yes Historical Provider, MD  isosorbide dinitrate (ISORDIL) 30 MG tablet Take 30 mg by mouth daily.   Yes Historical Provider, MD  lactulose (CHRONULAC) 10 GM/15ML solution Place 10 g rectally every 3 (three) days as needed for mild constipation.   Yes Historical Provider, MD  levothyroxine (SYNTHROID, LEVOTHROID) 112 MCG tablet Take 112 mcg by mouth daily before breakfast.   Yes Historical Provider, MD  LORazepam (ATIVAN) 2 MG/ML injection Inject 2 mg into the vein every 2 (two) hours as needed for anxiety.   Yes Historical Provider, MD  Multiple Vitamins-Minerals (MULTIVITAMIN WITH MINERALS) tablet Take 1 tablet by mouth daily.   Yes Historical Provider, MD  OXYGEN Inhale 2 L into the lungs continuous.   Yes Historical Provider, MD  risperiDONE (RISPERDAL) 2 MG tablet Take 2 mg by mouth 2 (two) times daily.   Yes Historical Provider, MD  senna (SENOKOT) 8.6 MG tablet Take 2 tablets by mouth daily.   Yes Historical Provider, MD  Valproic Acid 250 MG CPDR Take 1 capsule by mouth 3 (three) times daily.   Yes Historical Provider, MD   No Known Allergies Review of Systems  Unable to perform ROS: Dementia    Physical Exam  Constitutional: No distress.  HENT:  Head: Normocephalic and atraumatic.  Cardiovascular:  Normal rate.   No edema  Pulmonary/Chest: Effort normal. No respiratory distress.  Abdominal: Soft. He exhibits no distension. There is no guarding.  Neurological: He is alert.  Oriented to self only  Skin: Skin is warm and dry.  Psychiatric:  Unintelligible words at least 50% of the time  Nursing note and vitals reviewed.   Vital Signs: BP 133/62 mmHg  Pulse 78  Temp(Src) 99.4 F (37.4 C) (Axillary)  Resp 20  Ht  (1.803 m)  Wt 81.7 kg (180 lb 1.9 oz)  BMI 25.13 kg/m2  SpO2 98% Pain Assessment: PAINAD   Pain Score: Asleep   SpO2: SpO2: 98 % O2 Device:SpO2: 98 % O2 Flow Rate: .O2 Flow Rate (L/min): 2 L/min  IO: Intake/output summary:  Intake/Output Summary (Last 24 hours) at 04/15/16 1113 Last data filed at 04/15/16 1025  Gross per 24 hour  Intake    210 ml  Output      0 ml  Net    210 ml    LBM:   Baseline Weight: Weight: 88.451 kg (195 lb) Most recent weight: Weight: 81.7 kg (180 lb 1.9 oz)     Palliative Assessment/Data:   Flowsheet Rows        Most Recent Value   Intake Tab    Referral Department  Hospitalist   Unit at Time of Referral  Cardiac/Telemetry Unit   Palliative Care Primary Diagnosis  Pulmonary   Date Notified  04/13/16   Palliative Care Type  New Palliative care   Date of Admission  04/13/16   Date first seen by Palliative Care  04/14/16   # of days Palliative referral response time  1 Day(s)   # of days IP prior to Palliative referral  0   Clinical Assessment    Palliative Performance Scale Score  20%   Pain Max last 24 hours  Not able to report   Pain Min Last 24 hours  Not able to report   Dyspnea Max Last 24 Hours  Not able to report   Dyspnea Min Last 24 hours  Not able to report   Psychosocial & Spiritual Assessment    Palliative Care Outcomes    Patient/Family meeting held?  Yes   Who was at the meeting?  Brief telephone call with sister-in-law Rosalia Hammers 4/26.  GOC discussion completed with [facility] 4/27.    Palliative Care Outcomes  Clarified goals of care, Provided psychosocial or spiritual support, Counseled regarding hospice   Patient/Family wishes: Interventions discontinued/not started   Mechanical Ventilation, Tube feedings/TPN, PEG   Palliative Care follow-up planned  -- [Follow-up at [facility]]      Time In: 1005 Time Out: 1120 Time Total: 75 minutes Greater than 50%  of this time was spent counseling and coordinating care related to the above assessment and plan.  Signed by: Katheran Awe, NP   Please contact Palliative Medicine Team phone at [phone] for questions and concerns.

## 2016-04-15 NOTE — Progress Notes (Signed)
PROGRESS NOTE    Jerry [name]  WUJ:[id number] DOB: [birth year] DOA: 04/13/2016 PCP: Audree Bane, DO  Outpatient Specialists:    Brief Narrative:  Patient is an 82 year old man with a history of ischemic heart disease, seizure disorder, hypertension, chronic kidney disease, and dementia, presumed to be from previous alcoholism. He was admitted to the hospital 04/13/2016 from a local skilled nursing facility with a complaint of cough and lethargy. In the ED, chest x-ray revealed right lower lobe pneumonia. His lab data were also significant for a WBC of 14.4, sodium of 147, creatinine of 1.3, and troponin of 0.47. He was admitted for treatment of acute encephalopathy and HCAP.   Assessment & Plan:   Principal Problem:   Acute encephalopathy Active Problems:   HCAP (healthcare-associated pneumonia)   Elevated troponin   Aspiration pneumonia (HCC)   PAF (paroxysmal atrial fibrillation) (HCC)   Dementia   AKI (acute kidney injury) (HCC)   Hypernatremia   Leukocytosis   Palliative care encounter   DNR (do not resuscitate)   Dysphagia   1. Healthcare acquired and  aspiration pneumonia. Patient was started on Unasyn for possible aspiration pneumonia in a healthcare setting. The infiltrate is on the right side and given his dementia, aspiration was suspected. Speech therapy was consulted and bedside swallow evaluation yielded dysphagia and noted aspiration. His diet was downgraded. -We'll continue current antibiotic.  Acute respiratory failure with hypoxia, secondary to pneumonia. Patient's oxygen saturation was 88% on room air in the ED. He was started on oxygen. His 02 saturations have improved on oxygen. Will reassess to see if he will need supplemental oxygen at discharge.   Elevated troponin I secondary to non-ST elevation MI versus demand ischemia from infection and atrial fibrillation. Patient's troponin I was 0.47 on admission. His EKG revealed atrial fibrillation without  concerning ST elevations. -Patient's troponin I is trending downward. 2-D echocardiogram ordered for evaluation and revealed no LV wall motion abnormalities an EF of 60-65%.   -He is treated chronically with Plavix and isosorbide dinitrate; they were continued. -We will add low-dose beta blocker. -Given his chronic debilitation and dementia and normal EF, he is not a candidate for invasive diagnostic workup.  ? New onset PAF -Patient's EKG on admission and follow-up EKG 04/14/16 revealed atrial fibrillation. There appears to be no known recorded history of atrial fibrillation. His rate is controlled. -We'll continue Plavix. -We'll add low-dose Toprol XL. -2-D echocardiogram results noted above.  Acute kidney injury. Patient's creatinine was 1.31 on admission. He was started on IV fluids. His creatinine has improved. Etiology of his acute renal injury was secondary to prerenal azotemia and dehydration.  Hypernatremia. Patient's serum sodium was 147 on admission. He was started on half-normal saline. There is slight improvement in his serum sodium. We'll continue half normal saline for now. Hypokalemia. The patient's serum potassium was within normal limits on admission, but has fallen slightly with hypotonic IV fluids. -Oral potassium as tolerated was given and potassium wasadded to the IV fluids. Serum potassium has improved.  Advanced dementia with a history of behavioral disturbance. Per chart review, the patient has a history of alcoholism which is mentioned as the etiology of his dementia. He appears to be treated chronically with Risperdal and valproic acid and Lexapro and clonazepam. All were restarted. -He has demonstrated some confusion necessitating a sitter.  Acute encephalopathy with lethargy on admission. Etiology multifactorial, but primarily secondary to infection. We'll continue to monitor and provide supportive treatment.  Hypothyroidism. Synthroid was restarted. His  TSH  and free T4 were within normal limits.  Normocytic anemia. Likely of chronic disease. Will continue to monitor for signs of bleeding.  Mild-Moderate oropharyngeal dysphagia. The speech therapist evaluated the patient at the bedside and it yielded moderate dysphagia and aspiration. His diet was downgraded to dysphagia 2 with nectar thick liquids. -MBS results noted.  DO NOT RESUSCITATE status. Patient is chronically demented and debilitated. Palliative care consulted.     DVT prophylaxis: Lovenox Code Status: DO NOT RESUSCITATE Family Communication: Family not available Disposition Plan: Discharge back to SNF when clinically appropriate, likely in a couple of days.   Consultants:   Palliative care  Procedures:  Echo 04/14/16: - Left ventricle: The cavity size was normal. There was moderate  concentric hypertrophy. Systolic function was normal. The  estimated ejection fraction was in the range of 60% to 65%. Wall  motion was normal; there were no regional wall motion  abnormalities. The study was not technically sufficient to allow  evaluation of LV diastolic dysfunction due to atrial  fibrillation. Doppler parameters are consistent with high  ventricular filling pressure. - Aortic valve: Moderately calcified annulus. Mildly thickened,  mildly calcified leaflets. Cusp separation was reduced. There was  mild to moderate stenosis. Peak velocity (S): 216 cm/s. Mean  gradient (S): 11 mm Hg. Valve area (VTI): 1.22 cm^2. Valve area  (Vmax): 1.32 cm^2. Valve area (Vmean): 1.2 cm^2. - Mitral valve: Mildly thickened leaflets . There was moderate to  severe regurgitation. - Left atrium: The atrium was mildly to moderately dilated. - Atrial septum: No defect or patent foramen ovale was identified. - Tricuspid valve: There was moderate regurgitation. - Pulmonary arteries: Systolic pressure was mildly to moderately  increased. PA peak pressure: 48 mm Hg (S). - Systemic  veins: IVC dilated with normal respiratory variation.   Estimated CVP 8 mmHg.  Antimicrobials:   Unasyn 04/13/16>>   Subjective: Patient is sitting up in the chair. He denies shortness of breath. He is very hard of hearing.  Objective: Filed Vitals:   04/14/16 2205 04/14/16 2337 04/15/16 0651 04/15/16 1315  BP: 146/58  133/62 135/79  Pulse: 80  78   Temp: 100.2 F (37.9 C) 99.2 F (37.3 C) 99.4 F (37.4 C) 98.4 F (36.9 C)  TempSrc: Axillary Axillary Axillary Oral  Resp: 20  20 19   Height:      Weight:      SpO2: 97%  98% 100%    Intake/Output Summary (Last 24 hours) at 04/15/16 1756 Last data filed at 04/15/16 1756  Gross per 24 hour  Intake 1029.33 ml  Output      0 ml  Net 1029.33 ml   Filed Weights   04/13/16 0958 04/13/16 1442  Weight: 88.451 kg (195 lb) 81.7 kg (180 lb 1.9 oz)    Examination:  General exam: Elderly man in no acute distress, but appears chronically debilitated  Respiratory system: Lungs with right mid lobe fine crackles. Respiratory effort normal. Cardiovascular system: Irregular, irregular No pedal edema. Gastrointestinal system: Abdomen is nondistended, soft and nontender. No organomegaly or masses felt. Normal bowel sounds heard. Central nervous system: He is alert, but not oriented to time place or year. He is very hard of hearing. Extremities: Symmetric bilateral handgrip 5 over 5. Skin: No rashes, lesions or ulcers Psychiatry: Flat affect and confused.    Data Reviewed: I have personally reviewed following labs and imaging studies  CBC:  Recent Labs Lab 04/13/16 1030 04/14/16 0511 04/15/16 0504  WBC 14.4* 8.7  7.7  NEUTROABS 11.8*  --   --   HGB 8.9* 9.3* 9.5*  HCT 27.6* 28.7* 29.8*  MCV 90.8 92.0 92.0  PLT 299 222 270   Basic Metabolic Panel:  Recent Labs Lab 04/13/16 1030 04/14/16 0511 04/15/16 0504  NA 147* 146* 146*  K 3.6 3.4* 3.6  CL 109 110 109  CO2 29 29 27   GLUCOSE 129* 96 104*  BUN 27* 25* 22*    CREATININE 1.31* 1.06 0.98  CALCIUM 8.9 8.3* 8.5*   GFR: Estimated Creatinine Clearance: 56.6 mL/min (by C-G formula based on Cr of 0.98). Liver Function Tests:  Recent Labs Lab 04/13/16 1030  AST 23  ALT 21  ALKPHOS 47  BILITOT 0.6  PROT 7.0  ALBUMIN 2.9*   No results for input(s): LIPASE, AMYLASE in the last 168 hours. No results for input(s): AMMONIA in the last 168 hours. Coagulation Profile:  Recent Labs Lab 04/13/16 1030  INR 1.35   Cardiac Enzymes:  Recent Labs Lab 04/13/16 1030 04/13/16 1714 04/13/16 2248 04/14/16 0511  TROPONINI 0.47* 0.41* 0.36* 0.32*   BNP (last 3 results) No results for input(s): PROBNP in the last 8760 hours. HbA1C: No results for input(s): HGBA1C in the last 72 hours. CBG: No results for input(s): GLUCAP in the last 168 hours. Lipid Profile: No results for input(s): CHOL, HDL, LDLCALC, TRIG, CHOLHDL, LDLDIRECT in the last 72 hours. Thyroid Function Tests:  Recent Labs  04/15/16 0504  TSH 1.184  FREET4 1.08   Anemia Panel: No results for input(s): VITAMINB12, FOLATE, FERRITIN, TIBC, IRON, RETICCTPCT in the last 72 hours. Urine analysis: No results found for: COLORURINE, APPEARANCEUR, LABSPEC, PHURINE, GLUCOSEU, HGBUR, BILIRUBINUR, KETONESUR, PROTEINUR, UROBILINOGEN, NITRITE, LEUKOCYTESUR Sepsis Labs: @LABRCNTIP (procalcitonin:4,lacticidven:4)  ) Recent Results (from the past 240 hour(s))  MRSA PCR Screening     Status: None   Collection Time: 04/13/16  4:20 PM  Result Value Ref Range Status   MRSA by PCR NEGATIVE NEGATIVE Final    Comment:        The GeneXpert MRSA Assay (FDA approved for NASAL specimens only), is one component of a comprehensive MRSA colonization surveillance program. It is not intended to diagnose MRSA infection nor to guide or monitor treatment for MRSA infections.          Radiology Studies: Dg Swallowing Func-speech Pathology  04/14/2016  Objective Swallowing Evaluation: Type of  Study: MBS-Modified Barium Swallow Study Patient Details Name: Jerry [name] MRN: [id number] Date of Birth: 04/18/1926 Today's Date: 04/14/2016 Time: SLP Start Time (ACUTE ONLY): 1434-SLP Stop Time (ACUTE ONLY): 1451 SLP Time Calculation (min) (ACUTE ONLY): 17 min Past Medical History: Past Medical History Diagnosis Date . Ischemic heart disease    on plavix . Encephalopathy  . Hypertension  . Alcohol dependence with alcohol-induced persisting dementia (HCC)  . Dysphagia  . GAD (generalized anxiety disorder)  . CKD (chronic kidney disease)  . MDD (major depressive disorder) (HCC)  . Hyperlipemia  . Cognitive communication deficit  . GERD (gastroesophageal reflux disease)  . Hypothyroidism  . Anemia  . Dementia  . Delusional disorder (HCC)  . Seizures (HCC)    on Valproic acid . Coronary artery disease  . Former smoker, stopped smoking in distant past  Past Surgical History: No past surgical history on file. HPI: [name] is a 82 y/o man brought to ED from nursing home where he was noted to have worsening cough and increased sputum production. These symptoms were progressive over the past week. He has not  been clearly febrile during the interval. His mental status is questionably worsened with incoherent speech since this morning, however he has severe dementia at baseline. After arrival in the ED he is noted to be mildly hypoxic with O2 saturation <88% that improves with supplemental oxygen. Heart rate mildly tachycardic in Afib which is not documented in nursing home records PTA. He was recently discharged from [facility] on 4/21 with dx of PNA. Chest X-Ray reveals "There is hazy infiltrate in right base suspicious for pneumonia." Assessment / Plan / Recommendation CHL IP CLINICAL IMPRESSIONS 04/14/2016 Therapy Diagnosis Mild oral phase dysphagia;Moderate oral phase dysphagia;Mild pharyngeal phase dysphagia;Moderate pharyngeal phase dysphagia Clinical Impression Pt presents with mild/moderate  oropharyngeal dysphagia of unknown etiology. Pt consumed thin liquids via cup and straw demonstrating uncoordinated bolus cohesion, delayed oral transit with poor awareness of bolus resulting in premature spillage and a delayed swallow that was initiated at the level of the pyriform sinuses, Inconsistent NOTED ASPIRATION before and during the swallow that was sensed but not cleared despite strong reflexive cough. Decreased base of tongue retration resulting in valleculae residue with thin and nectar trials. Nectar trials yielded uncoordinated oral stage and a gross amount of premature spillage with delayed swallow initiation to the level of the pyriform sinuses and no penetration or aspiration although mild vallecular residue was noted but cleared with reflexive and voluntary (verbal cues to provide) second swallow. Pt consumed puree and mechanical soft textures demonstrating uncoordinated bolus cohesion, some piecemeal deglutition, swallow initiation at the level of the valleculae and mild valleculae residue after the swallow cleared with reflexive second swallow. Recommend D2 (ground)/Nectar Thick Liquids and meds to be administered crushed in puree.  Impact on safety and function Moderate aspiration risk   CHL IP TREATMENT RECOMMENDATION 04/14/2016 Treatment Recommendations Therapy as outlined in treatment plan below   Prognosis 04/14/2016 Prognosis for Safe Diet Advancement Fair Barriers to Reach Goals Cognitive deficits Barriers/Prognosis Comment -- CHL IP DIET RECOMMENDATION 04/14/2016 SLP Diet Recommendations Dysphagia 2 (Fine chop) solids;Nectar thick liquid Liquid Administration via Cup;Straw Medication Administration Crushed with puree Compensations Slow rate;Small sips/bites;Follow solids with liquid Postural Changes Seated upright at 90 degrees   CHL IP OTHER RECOMMENDATIONS 04/14/2016 Recommended Consults -- Oral Care Recommendations Oral care BID Other Recommendations Order thickener from pharmacy   CHL IP  FOLLOW UP RECOMMENDATIONS 04/14/2016 Follow up Recommendations Skilled Nursing facility   [facility] IP FREQUENCY AND DURATION 04/14/2016 Speech Therapy Frequency (ACUTE ONLY) min 2x/week Treatment Duration 2 weeks      CHL IP ORAL PHASE 04/14/2016 Oral Phase Impaired Oral - Pudding Teaspoon -- Oral - Pudding Cup -- Oral - Honey Teaspoon -- Oral - Honey Cup -- Oral - Nectar Teaspoon -- Oral - Nectar Cup Holding of bolus;Delayed oral transit;Decreased bolus cohesion;Premature spillage Oral - Nectar Straw Premature spillage;Decreased bolus cohesion;Delayed oral transit;Holding of bolus Oral - Thin Teaspoon -- Oral - Thin Cup Premature spillage;Decreased bolus cohesion;Delayed oral transit;Holding of bolus Oral - Thin Straw Premature spillage;Decreased bolus cohesion;Delayed oral transit;Holding of bolus Oral - Puree Delayed oral transit;Premature spillage;Decreased bolus cohesion Oral - Mech Soft Premature spillage;Decreased bolus cohesion;Delayed oral transit;Impaired mastication;Weak lingual manipulation Oral - Regular -- Oral - Multi-Consistency -- Oral - Pill -- Oral Phase - Comment --  CHL IP PHARYNGEAL PHASE 04/14/2016 Pharyngeal Phase Impaired Pharyngeal- Pudding Teaspoon -- Pharyngeal -- Pharyngeal- Pudding Cup -- Pharyngeal -- Pharyngeal- Honey Teaspoon -- Pharyngeal -- Pharyngeal- Honey Cup -- Pharyngeal -- Pharyngeal- Nectar Teaspoon -- Pharyngeal -- Pharyngeal- Nectar Cup Delayed swallow  initiation-pyriform sinuses;Pharyngeal residue - valleculae;Reduced tongue base retraction Pharyngeal -- Pharyngeal- Nectar Straw Delayed swallow initiation-pyriform sinuses;Pharyngeal residue - valleculae;Reduced tongue base retraction Pharyngeal -- Pharyngeal- Thin Teaspoon -- Pharyngeal -- Pharyngeal- Thin Cup Delayed swallow initiation-pyriform sinuses;Pharyngeal residue - valleculae;Penetration/Aspiration before swallow;Penetration/Aspiration during swallow;Reduced tongue base retraction;Reduced airway/laryngeal closure  Pharyngeal Material enters airway, passes BELOW cords and not ejected out despite cough attempt by patient Pharyngeal- Thin Straw Delayed swallow initiation-pyriform sinuses;Penetration/Aspiration before swallow;Penetration/Aspiration during swallow;Reduced airway/laryngeal closure;Pharyngeal residue - valleculae Pharyngeal Material enters airway, passes BELOW cords and not ejected out despite cough attempt by patient Pharyngeal- Puree Delayed swallow initiation-vallecula;Pharyngeal residue - valleculae Pharyngeal -- Pharyngeal- Mechanical Soft Delayed swallow initiation-vallecula Pharyngeal -- Pharyngeal- Regular -- Pharyngeal -- Pharyngeal- Multi-consistency -- Pharyngeal -- Pharyngeal- Pill -- Pharyngeal -- Pharyngeal Comment --  Amelia H. Romie Levee, CCC-SLP Speech Language Pathologist Georgetta Haber 04/14/2016, 3:36 PM                   Scheduled Meds: . ampicillin-sulbactam (UNASYN) IV  3 g Intravenous Q8H  . clonazePAM  0.25 mg Oral QHS  . clopidogrel  75 mg Oral Daily  . enoxaparin (LOVENOX) injection  40 mg Subcutaneous Q24H  . escitalopram  10 mg Oral Daily  . isosorbide dinitrate  30 mg Oral Daily  . levothyroxine  112 mcg Oral QAC breakfast  . multivitamin with minerals  1 tablet Oral Daily  . potassium chloride  20 mEq Oral Daily  . risperiDONE  2 mg Oral BID  . Valproate Sodium  250 mg Oral TID   Continuous Infusions: . 0.45 % NaCl with KCl 20 mEq / L 70 mL/hr at 04/15/16 1012     LOS: 2 days    Time spent: 40 minutes    Elliot Cousin, MD [facility] Pager [phone]  If 7PM-7AM, please contact night-coverage [email or web address] Password [facility] 04/15/2016, 5:56 PM

## 2016-04-16 DIAGNOSIS — F0391 Unspecified dementia with behavioral disturbance: Secondary | ICD-10-CM

## 2016-04-16 LAB — CBC
HCT: 30.6 % — ABNORMAL LOW (ref 39.0–52.0)
HEMOGLOBIN: 9.8 g/dL — AB (ref 13.0–17.0)
MCH: 29.6 pg (ref 26.0–34.0)
MCHC: 32 g/dL (ref 30.0–36.0)
MCV: 92.4 fL (ref 78.0–100.0)
PLATELETS: 249 10*3/uL (ref 150–400)
RBC: 3.31 MIL/uL — AB (ref 4.22–5.81)
RDW: 14.8 % (ref 11.5–15.5)
WBC: 7.3 10*3/uL (ref 4.0–10.5)

## 2016-04-16 LAB — BASIC METABOLIC PANEL
ANION GAP: 8 (ref 5–15)
BUN: 17 mg/dL (ref 6–20)
CO2: 27 mmol/L (ref 22–32)
Calcium: 8.5 mg/dL — ABNORMAL LOW (ref 8.9–10.3)
Chloride: 109 mmol/L (ref 101–111)
Creatinine, Ser: 0.99 mg/dL (ref 0.61–1.24)
Glucose, Bld: 107 mg/dL — ABNORMAL HIGH (ref 65–99)
POTASSIUM: 4.2 mmol/L (ref 3.5–5.1)
SODIUM: 144 mmol/L (ref 135–145)

## 2016-04-16 NOTE — Progress Notes (Signed)
Daily Progress Note   Patient Name: Jerry [name]       Date: 04/16/2016 DOB: 03/24/1926  Age: 82 y.o. MRN#: [id number] Attending Physician: Elliot Cousinenise Fisher, MD Primary Care Physician: Audree BanePeter King, DO Admit Date: 04/13/2016  Reason for Consultation/Follow-up: Disposition, Establishing goals of care, Hospice Evaluation and Psychosocial/spiritual support  Subjective: Jerry [name] is what resting quietly in bed, he briefly makes eye contact with me, but is unable to communicate in any meaningful way. No family at bedside, relatives live out of town.  Call to sister-in-law Jerry [name] for update regarding health status and disposition. [name] shares that she has discussed Mr. Tejera's care with his sisters and the goal is for Jerry [name] to [facility] [place], and let nature take its course. She shares that Mr. Sparrow's sister had the services of hospice when she lost two husbands. Their goal is for him to return with the benefits of hospice through [facility]. We discuss treating what's treatable, but focusing on comfort and dignity. We talk about recurrence of aspiration pneumonia.  Length of Stay: 3  Current Medications: Scheduled Meds:  . ampicillin-sulbactam (UNASYN) IV  3 g Intravenous Q8H  . clonazePAM  0.25 mg Oral QHS  . clopidogrel  75 mg Oral Daily  . enoxaparin (LOVENOX) injection  40 mg Subcutaneous Q24H  . escitalopram  10 mg Oral Daily  . isosorbide dinitrate  30 mg Oral Daily  . levothyroxine  112 mcg Oral QAC breakfast  . metoprolol succinate  12.5 mg Oral Daily  . multivitamin with minerals  1 tablet Oral Daily  . risperiDONE  2 mg Oral BID  . Valproate Sodium  250 mg Oral TID    Continuous Infusions: . 0.45 % NaCl with KCl 20 mEq / L 70  mL/hr at 04/15/16 1012    PRN Meds: acetaminophen **OR** acetaminophen, ondansetron **OR** ondansetron (ZOFRAN) IV, senna-docusate  Physical Exam  Constitutional: No distress.  Cardiovascular: Normal rate.   Pulmonary/Chest: Effort normal. No respiratory distress.  Abdominal: Soft. He exhibits no distension.  Neurological: He is alert.  Mostly unintelligible speech.   Skin: Skin is warm and dry.  Nursing note and vitals reviewed.           Vital Signs: BP 128/72 mmHg  Pulse 76  Temp(Src) 98.5 F (36.9 C) (Oral)  Resp 20  Ht  (1.803 m)  Wt 81.7 kg (180 lb 1.9 oz)  BMI 25.13 kg/m2  SpO2 100% SpO2: SpO2: 100 % O2 Device: O2 Device: Nasal Cannula O2 Flow Rate: O2 Flow Rate (L/min): 2 L/min  Intake/output summary:  Intake/Output Summary (Last 24 hours) at 04/16/16 1326 Last data filed at 04/16/16 1236  Gross per 24 hour  Intake 1129.33 ml  Output      0 ml  Net 1129.33 ml   LBM: Last BM Date:  (unknown) Baseline Weight: Weight: 88.451 kg (195 lb) Most recent weight: Weight: 81.7 kg (180 lb 1.9 oz)       Palliative Assessment/Data:    Flowsheet Rows        Most Recent Value   Intake Tab    Referral Department  Hospitalist   Unit at Time of Referral  Cardiac/Telemetry Unit   Palliative Care Primary Diagnosis  Pulmonary   Date Notified  04/13/16   Palliative Care Type  New Palliative care   Date of Admission  04/13/16   Date first seen by Palliative Care  04/14/16   # of days Palliative referral response time  1 Day(s)   # of days IP prior to Palliative referral  0   Clinical Assessment    Palliative Performance Scale Score  20%   Pain Max last 24 hours  Not able to report   Pain Min Last 24 hours  Not able to report   Dyspnea Max Last 24 Hours  Not able to report   Dyspnea Min Last 24 hours  Not able to report   Psychosocial & Spiritual Assessment    Palliative Care Outcomes    Patient/Family meeting held?  Yes   Who was at the meeting?  Brief  telephone call with sister-in-law Jerry [name] 4/26.  GOC discussion completed with [facility] 4/27.   Palliative Care Outcomes  Clarified goals of care, Provided psychosocial or spiritual support, Counseled regarding hospice   Patient/Family wishes: Interventions discontinued/not started   Mechanical Ventilation, Tube feedings/TPN, PEG   Palliative Care follow-up planned  -- [Follow-up at [facility]]      Patient Active Problem List   Diagnosis Date Noted  . Pulmonary hypertension (HCC) 04/15/2016  . Persistent atrial fibrillation (HCC)   . Acute encephalopathy 04/14/2016  . PAF (paroxysmal atrial fibrillation) (HCC) 04/14/2016  . Dysphagia 04/14/2016  . Palliative care encounter   . DNR (do not resuscitate)   . HCAP (healthcare-associated pneumonia) 04/13/2016  . Dementia 04/13/2016  . AKI (acute kidney injury) (HCC) 04/13/2016  . Hypernatremia 04/13/2016  . Leukocytosis 04/13/2016  . Elevated troponin 04/13/2016  . Aspiration pneumonia (HCC) 04/13/2016    Palliative Care Assessment & Plan   Patient Profile: [name] is a an 82 year old male with the extensive cardiac history, and severe dementia who presents with recurrent aspiration pneumonia.  Assessment: as above  Recommendations/Plan:  Return to [facility] [place], with [facility] hospice  Goals of Care and Additional Recommendations:  Limitations on Scope of Treatment: Allow a natural death, no extraordinary measures. Let nature take its course.  Code Status:    Code Status Orders        Start     Ordered   04/14/16 1610  Do not attempt resuscitation (DNR)   Continuous    Question Answer Comment  In the event of cardiac or respiratory ARREST Do not call a "code blue"   In the event of cardiac or respiratory ARREST Do not perform Intubation,  CPR, defibrillation or ACLS   In the event of cardiac or respiratory ARREST Use medication by any route, position, wound care, and other measures to relive pain and  suffering. May use oxygen, suction and manual treatment of airway obstruction as needed for comfort.      04/14/16 1609    Code Status History    Date Active Date Inactive Code Status Order ID Comments User Context   04/13/2016  4:43 PM 04/14/2016  4:09 PM Full Code [id number]  Henderson Cloud, MD Inpatient       Prognosis:   < 3 months, likely based on recurrent aspiration pneumonia, and family's decision to let nature take its course.  Discharge Planning:  Skilled Nursing Facility with Hospice  Care plan was discussed with nursing staff, CM, SW, and Dr. Sherrie Mustache on next rounds.  Thank you for allowing the Palliative Medicine Team to assist in the care of this patient.   Time In: 1300 Time Out: 1325 Total Time 25 minutes  Prolonged Time Billed  no       Greater than 50%  of this time was spent counseling and coordinating care related to the above assessment and plan.  Juliauna Stueve A, NP  Please contact Palliative Medicine Team phone at [phone] for questions and concerns.

## 2016-04-16 NOTE — Care Management Note (Addendum)
Case Management Note  Patient Details  Name: Ernestine McmurrayWillis Zarr MRN: [id number] Date of Birth: 12/04/1926  Subjective/Objective:        Discussed case with Lillia Carmelasha Dove palliative care NP who stated that Belinda Edds patient sister had decided to go with [facility].         Action/Plan: [facility] services   Expected Discharge Date:                  Expected Discharge Plan:  Home w Hospice Care  In-House Referral:     Discharge planning Services  CM Consult  Post Acute Care Choice:    Choice offered to:     DME Arranged:    DME Agency:     HH Arranged:    HH Agency:  [facility]  Status of Service:  Completed, signed off  Medicare Important Message Given:  Yes Date Medicare IM Given:    Medicare IM give by:    Date Additional Medicare IM Given:    Additional Medicare Important Message give by:     If discussed at Long Length of Stay Meetings, dates discussed:    Additional Comments: Referral placed with Erick Colaceharles Alston at [facility].  [name], Grayce Budden L, RN 04/16/2016, 3:23 PM

## 2016-04-16 NOTE — Care Management Important Message (Signed)
Important Message  Patient Details  Name: Jerry [name] MRN: [id number] Date of Birth: 01/08/1926   Medicare Important Message Given:  Yes    [name], Tehani Mersman L, RN 04/16/2016, 9:19 AM

## 2016-04-16 NOTE — Progress Notes (Signed)
Pharmacy Antibiotic Note-update  Jerry [name] is a 82 y.o. male admitted on 04/13/2016 with aspiration  pneumonia.  Pharmacy has been consulted for unasyn dosing. [facility] with dx of PNA  Plan: Unasyn 3gm IV q8h F/u renal function, cultures and clinical course  Height: 5\' 11"  (180.3 cm) Weight: 180 lb 1.9 oz (81.7 kg) IBW/kg (Calculated) : 75.3  Temp (24hrs), Avg:98.3 F (36.8 C), Min:98 F (36.7 C), Max:98.5 F (36.9 C)   Recent Labs Lab 04/13/16 1030 04/14/16 0511 04/15/16 0504 04/16/16 0554  WBC 14.4* 8.7 7.7 7.3  CREATININE 1.31* 1.06 0.98 0.99  LATICACIDVEN 1.4  --   --   --     Estimated Creatinine Clearance: 56 mL/min (by C-G formula based on Cr of 0.99).    No Known Allergies  Antimicrobials this admission: vanc 4/25 >> 4/25 cefepime 4/25 >>4/25 Unasyn 4/25>>    Thank you for allowing pharmacy to be a part of this patient's care.  Talbert CageLora Lavonna Lampron, PharmD Clinical Pharmacist 04/16/2016 10:58 AM

## 2016-04-17 ENCOUNTER — Inpatient Hospital Stay (HOSPITAL_COMMUNITY): Payer: Medicare Other

## 2016-04-17 DIAGNOSIS — J90 Pleural effusion, not elsewhere classified: Secondary | ICD-10-CM

## 2016-04-17 LAB — CBC
HCT: 30.7 % — ABNORMAL LOW (ref 39.0–52.0)
HEMOGLOBIN: 9.7 g/dL — AB (ref 13.0–17.0)
MCH: 29.4 pg (ref 26.0–34.0)
MCHC: 31.6 g/dL (ref 30.0–36.0)
MCV: 93 fL (ref 78.0–100.0)
PLATELETS: 254 10*3/uL (ref 150–400)
RBC: 3.3 MIL/uL — AB (ref 4.22–5.81)
RDW: 14.9 % (ref 11.5–15.5)
WBC: 7.4 10*3/uL (ref 4.0–10.5)

## 2016-04-17 LAB — BASIC METABOLIC PANEL
ANION GAP: 8 (ref 5–15)
BUN: 16 mg/dL (ref 6–20)
CHLORIDE: 108 mmol/L (ref 101–111)
CO2: 26 mmol/L (ref 22–32)
Calcium: 8.7 mg/dL — ABNORMAL LOW (ref 8.9–10.3)
Creatinine, Ser: 1.05 mg/dL (ref 0.61–1.24)
GFR calc Af Amer: >60 mL/min (ref >60)
GLUCOSE: 90 mg/dL (ref 65–99)
POTASSIUM: 5 mmol/L (ref 3.5–5.1)
Sodium: 142 mmol/L (ref 135–145)

## 2016-04-17 MED ORDER — FUROSEMIDE 10 MG/ML IJ SOLN
20.0000 mg | Freq: Once | INTRAMUSCULAR | Status: AC
  Administered 2016-04-17: 20 mg via INTRAVENOUS
  Filled 2016-04-17: qty 2

## 2016-04-17 NOTE — Progress Notes (Signed)
PROGRESS NOTE    Anne HahnWillis Guard  UVO:[id number] DOB: 01/26/1926 DOA: 04/13/2016 PCP: Audree BanePeter King, DO  Outpatient Specialists:    Brief Narrative:  Patient is an 82 year old man with a history of ischemic heart disease, seizure disorder, hypertension, chronic kidney disease, and dementia, presumed to be from previous alcoholism. He was admitted to the hospital 04/13/2016 from a local skilled nursing facility with a complaint of cough and lethargy. In the ED, chest x-ray revealed right lower lobe pneumonia. His lab data were also significant for a WBC of 14.4, sodium of 147, creatinine of 1.3, and troponin of 0.47. He was admitted for treatment of acute encephalopathy and HCAP.   Assessment & Plan:   Principal Problem:   Acute encephalopathy Active Problems:   HCAP (healthcare-associated pneumonia)   Elevated troponin   Aspiration pneumonia (HCC)   PAF (paroxysmal atrial fibrillation) (HCC)   Persistent atrial fibrillation (HCC)   Bilateral pleural effusion   Dementia   AKI (acute kidney injury) (HCC)   Hypernatremia   Leukocytosis   Palliative care encounter   DNR (do not resuscitate)   Dysphagia   Pulmonary hypertension (HCC)   1. Healthcare acquired and  aspiration pneumonia. Patient was started on Unasyn for possible aspiration pneumonia in a healthcare setting. The infiltrate is on the right side and given his dementia, aspiration was suspected. Speech therapy was consulted and bedside swallow evaluation yielded dysphagia and noted aspiration. His diet was downgraded. -He is not febrile and his white blood cell count has improved. -We'll continue current antibiotic.  Bilateral pleural effusion. Patient was noted to have more congestion on exam, but was mostly upper airway. Follow-up chest x-ray on 04/17/16 revealed layering bilateral pleural effusions with bibasilar airspace opacities. -Effusions may be parapneumonic due to his aspiration pneumonia. -Although the effusions  may not be transudative his I/O is positive, so will give one IV dose of Lasix empirically to see if it helps to decrease his chest congestion and effusions. Will decrease IV fluids.  Acute respiratory failure with hypoxia, secondary to pneumonia. Patient's oxygen saturation was 88% on room air in the ED. He was started on oxygen. His 02 saturations have improved on oxygen. Will reassess to see if he will need supplemental oxygen at discharge, but with hospice, oxygen will be when necessary at the SNF.   Elevated troponin I secondary to non-ST elevation MI versus demand ischemia from infection and atrial fibrillation. Patient's troponin I was 0.47 on admission. His EKG revealed atrial fibrillation without concerning ST elevations. -Patient's troponin I  trended downward. 2-D echocardiogram ordered for evaluation and revealed no LV wall motion abnormalities and an EF of 60-65%.   -He is treated chronically with Plavix and isosorbide dinitrate; they were continued. Toprol was added at 12.5 mg daily. -Given his chronic debilitation and dementia and normal EF, he is not a candidate for invasive diagnostic workup.  ? New onset PAF -Patient's EKG on admission and follow-up EKG 04/14/16 revealed atrial fibrillation. There appears to be no known recorded history of atrial fibrillation. His rate is controlled. -Plavix was continued. Low-dose Toprol-XL was added. 2-D echocardiogram results noted. Patient continues to be in atrial fibrillation per exam.  Acute kidney injury. Patient's creatinine was 1.31 on admission. He was started on IV fluids. His creatinine has improved. Etiology of his acute renal injury was secondary to prerenal azotemia and dehydration.  Hypernatremia. Patient's serum sodium was 147 on admission. He was started on half-normal saline with improvement in his serum sodium to 142.  Hypokalemia. The patient's serum potassium was within normal limits on admission, but has fallen slightly  with hypotonic IV fluids. -Oral potassium as tolerated was given and potassium was added to the IV fluids. Serum potassium has improved.  Advanced dementia with a history of behavioral disturbance. Per chart review, the patient has a history of alcoholism which is mentioned as the etiology of his dementia. He appears to be treated chronically with Risperdal and valproic acid and Lexapro and clonazepam. All were restarted. -He has demonstrated some confusion necessitating a sitter.  Acute encephalopathy with lethargy on admission. Etiology multifactorial, but primarily secondary to infection. We'll continue to monitor and provide supportive treatment.  Hypothyroidism. Synthroid was restarted. His TSH and free T4 were within normal limits.  Normocytic anemia. Likely of chronic disease. Will continue to monitor for signs of bleeding.  Mild-Moderate oropharyngeal dysphagia. The speech therapist evaluated the patient at the bedside and it yielded moderate dysphagia and aspiration. His diet was downgraded to dysphagia 2 with nectar thick liquids.  DO NOT RESUSCITATE status. Patient is chronically demented and debilitated. Palliative care consulted. Palliative care NP, Ms. Dove discussed goals of care, hospice, and disposition with patient's sister-in-law Steward Drone. Following their discussion, Steward Drone was in agreement with instituting hospice services for the patient. Therefore, patient will be discharged to skilled nursing facility with hospice, likely in the next 24 hours.     DVT prophylaxis: Lovenox Code Status: DO NOT RESUSCITATE Family Communication: Family not available Disposition Plan: Discharge back to SNF when clinically appropriate, likely in a couple of days.   Consultants:   Palliative care  Procedures:  Echo 04/14/16: - Left ventricle: The cavity size was normal. There was moderate  concentric hypertrophy. Systolic function was normal. The  estimated ejection fraction was  in the range of 60% to 65%. Wall  motion was normal; there were no regional wall motion  abnormalities. The study was not technically sufficient to allow  evaluation of LV diastolic dysfunction due to atrial  fibrillation. Doppler parameters are consistent with high  ventricular filling pressure. - Aortic valve: Moderately calcified annulus. Mildly thickened,  mildly calcified leaflets. Cusp separation was reduced. There was  mild to moderate stenosis. Peak velocity (S): 216 cm/s. Mean  gradient (S): 11 mm Hg. Valve area (VTI): 1.22 cm^2. Valve area  (Vmax): 1.32 cm^2. Valve area (Vmean): 1.2 cm^2. - Mitral valve: Mildly thickened leaflets . There was moderate to  severe regurgitation. - Left atrium: The atrium was mildly to moderately dilated. - Atrial septum: No defect or patent foramen ovale was identified. - Tricuspid valve: There was moderate regurgitation. - Pulmonary arteries: Systolic pressure was mildly to moderately  increased. PA peak pressure: 48 mm Hg (S). - Systemic veins: IVC dilated with normal respiratory variation.   Estimated CVP 8 mmHg.  Antimicrobials:   Unasyn 04/13/16>>   Subjective: Patient is talking to himself. He is very hard of hearing. Nursing reports that he tries to get out of bed, but the sitter tries to redirect him.  Objective: Filed Vitals:   04/16/16 1426 04/16/16 2154 04/17/16 0609 04/17/16 1452  BP: 130/56 116/65 115/72 132/80  Pulse: 72 75 79 61  Temp: 98.2 F (36.8 C) 98.2 F (36.8 C) 97.5 F (36.4 C) 98.6 F (37 C)  TempSrc: Oral Oral Oral Oral  Resp: Height:      Weight:      SpO2: 90% 98% 96% 96%    Intake/Output Summary (Last 24 hours) at 04/17/16  1658 Last data filed at 04/17/16 1214  Gross per 24 hour  Intake    360 ml  Output      0 ml  Net    360 ml   Filed Weights   04/13/16 0958 04/13/16 1442  Weight: 88.451 kg (195 lb) 81.7 kg (180 lb 1.9 oz)    Examination:  General exam: Elderly  man in no acute distress, but appears chronically debilitated.He is speaking unintelligibly and talking to himself.  Respiratory system: Lungs with upper airway crackles and wheezes-tred to get the patient to cough, but he did not understand. Respiratory effort normal. Cardiovascular system: Irregular, irregular. No pedal edema. Gastrointestinal system: Abdomen is nondistended, soft and nontender. No organomegaly or masses felt. Normal bowel sounds heard. Central nervous system: He is alert and confused. Speaking to himself. He is very hard of hearing. Extremities: No pedal edema. Skin: No rashes, lesions or ulcers Psychiatry: Flat affect and confused.    Data Reviewed: I have personally reviewed following labs and imaging studies  CBC:  Recent Labs Lab 04/13/16 1030 04/14/16 0511 04/15/16 0504 04/16/16 0554 04/17/16 0805  WBC 14.4* 8.7 7.7 7.3 7.4  NEUTROABS 11.8*  --   --   --   --   HGB 8.9* 9.3* 9.5* 9.8* 9.7*  HCT 27.6* 28.7* 29.8* 30.6* 30.7*  MCV 90.8 92.0 92.0 92.4 93.0  PLT 299 222 270 249 254   Basic Metabolic Panel:  Recent Labs Lab 04/13/16 1030 04/14/16 0511 04/15/16 0504 04/16/16 0554 04/17/16 0805  NA 147* 146* 146* 144 142  K 3.6 3.4* 3.6 4.2 5.0  CL 109 110 109 109 108  CO2 GLUCOSE 129* 96 104* 107* 90  BUN 27* 25* 22* 17 16  CREATININE 1.31* 1.06 0.98 0.99 1.05  CALCIUM 8.9 8.3* 8.5* 8.5* 8.7*   GFR: Estimated Creatinine Clearance: 52.8 mL/min (by C-G formula based on Cr of 1.05). Liver Function Tests:  Recent Labs Lab 04/13/16 1030  AST 23  ALT 21  ALKPHOS 47  BILITOT 0.6  PROT 7.0  ALBUMIN 2.9*   No results for input(s): LIPASE, AMYLASE in the last 168 hours. No results for input(s): AMMONIA in the last 168 hours. Coagulation Profile:  Recent Labs Lab 04/13/16 1030  INR 1.35   Cardiac Enzymes:  Recent Labs Lab 04/13/16 1030 04/13/16 1714 04/13/16 2248 04/14/16 0511  TROPONINI 0.47* 0.41* 0.36* 0.32*    BNP (last 3 results) No results for input(s): PROBNP in the last 8760 hours. HbA1C: No results for input(s): HGBA1C in the last 72 hours. CBG: No results for input(s): GLUCAP in the last 168 hours. Lipid Profile: No results for input(s): CHOL, HDL, LDLCALC, TRIG, CHOLHDL, LDLDIRECT in the last 72 hours. Thyroid Function Tests:  Recent Labs  04/15/16 0504  TSH 1.184  FREET4 1.08   Anemia Panel: No results for input(s): VITAMINB12, FOLATE, FERRITIN, TIBC, IRON, RETICCTPCT in the last 72 hours. Urine analysis: No results found for: COLORURINE, APPEARANCEUR, LABSPEC, PHURINE, GLUCOSEU, HGBUR, BILIRUBINUR, KETONESUR, PROTEINUR, UROBILINOGEN, NITRITE, LEUKOCYTESUR Sepsis Labs: (procalcitonin:4,lacticidven:4)  ) Recent Results (from the past 240 hour(s))  MRSA PCR Screening     Status: None   Collection Time: 04/13/16  4:20 PM  Result Value Ref Range Status   MRSA by PCR NEGATIVE NEGATIVE Final    Comment:        The GeneXpert MRSA Assay (FDA approved for NASAL specimens only), is one component of a comprehensive MRSA colonization surveillance program. It  is not intended to diagnose MRSA infection nor to guide or monitor treatment for MRSA infections.          Radiology Studies: Dg Chest Port 1 View  04/17/2016  CLINICAL DATA:  Patient with history of pneumonia. EXAM: PORTABLE CHEST 1 VIEW COMPARISON:  Chest radiograph 04/13/2016. FINDINGS: Patient is rotated to the right. Monitoring leads overlie the patient. Cardiomegaly. Status post median sternotomy. Layering bilateral pleural effusions. Underlying pulmonary consolidation. IMPRESSION: Layering bilateral pleural effusions with underlying bibasilar airspace opacities which may represent atelectasis or infection. Electronically Signed   By: Annia Belt M.D.   On: 04/17/2016 10:00        Scheduled Meds: . ampicillin-sulbactam (UNASYN) IV  3 g Intravenous Q8H  . clonazePAM  0.25 mg Oral QHS  .  clopidogrel  75 mg Oral Daily  . enoxaparin (LOVENOX) injection  40 mg Subcutaneous Q24H  . escitalopram  10 mg Oral Daily  . isosorbide dinitrate  30 mg Oral Daily  . levothyroxine  112 mcg Oral QAC breakfast  . metoprolol succinate  12.5 mg Oral Daily  . multivitamin with minerals  1 tablet Oral Daily  . risperiDONE  2 mg Oral BID  . Valproate Sodium  250 mg Oral TID   Continuous Infusions: . 0.45 % NaCl with KCl 20 mEq / L 70 mL/hr at 04/17/16 0953     LOS: 4 days    Time spent: 30 minutes    Elliot Cousin, MD [facility] Pager [phone]  If 7PM-7AM, please contact night-coverage [email or web address] Password [facility] 04/17/2016, 4:58 PM

## 2016-04-18 MED ORDER — METOPROLOL SUCCINATE ER 25 MG PO TB24: 12.5000 mg | ORAL_TABLET | Freq: Every day | ORAL | Status: AC

## 2016-04-18 MED ORDER — AMOXICILLIN 500 MG PO TABS: 500.0000 mg | ORAL_TABLET | Freq: Two times a day (BID) | ORAL | Status: AC

## 2016-04-18 NOTE — Discharge Summary (Signed)
Physician Discharge Summary  Jerry [name] ZOX:[id number] DOB: 04-03-1926 DOA: 04/13/2016  PCP: Audree Bane, DO  Admit date: 04/13/2016 Discharge date: 04/18/2016  Time spent: Greater than 30 minutes  Recommendations for Outpatient Follow-up:  1. Skilled nursing physician or extender: Please consult Hospice following the patient's arrival to the facility. The family has agreed for the patient to receive hospice services. They will continue to discuss the decision regarding rehospitalization versus keeping the patient at the facility for future treatment. 2. Recommend aspiration precautions and assistance with every meal. 3. Continue nasal cannula oxygen 2-3 L/m. 4. Patient was discharged back to the [facility], dementia unit.   Discharge Diagnoses:  1. Healthcare acquired and aspiration pneumonia. 2. Bilateral pleural effusion, possibly parapneumonic. 3. Acute respiratory failure with hypoxia superimposed on chronic respiratory failure with hypoxia secondary to pneumonia. 4. Elevated troponin I secondary to demand ischemia from infection and atrial fibrillation. 5. Newly diagnosed atrial fibrillation, persistent. 6. Acute kidney injury, secondary to prerenal azotemia. 7. Hypernatremia secondary to mild dehydration. 8. Hypokalemia. 9. Pulmonary hypertension per echo. 10. Advanced dementia with behavioral disturbance. 11. Hypothyroidism. 12. Normocytic anemia. 13. Acute encephalopathy with lethargy on admission. 14. DO NOT RESUSCITATE status.  Discharge Condition: Stable  Diet recommendation: Dysphagia 2 with nectar thickened liquids.  Filed Weights   04/13/16 0958 04/13/16 1442  Weight: 88.451 kg (195 lb) 81.7 kg (180 lb 1.9 oz)    History of present illness:  Patient is an 82 year old man with a history of ischemic heart disease, seizure disorder, hypertension, chronic kidney disease, and dementia, presumed to be from previous alcoholism. He was admitted to the hospital  04/13/2016 from a local skilled nursing facility with a complaint of cough and lethargy. In the ED, his chest x-ray revealed right lower lobe pneumonia. His lab data were also significant for a WBC of 14.4, sodium of 147, creatinine of 1.3, and troponin of 0.47. He was admitted for treatment of acute encephalopathy and HCAP.  Hospital Course:  1. Healthcare acquired and aspiration pneumonia. Patient was started on Unasyn for possible aspiration pneumonia in a healthcare setting. The infiltrate is on the right side and given his dementia, aspiration was suspected. Speech therapy was consulted and bedside swallow evaluation yielded dysphagia and noted aspiration. His diet was downgraded. -He improved clinically and symptomatically. He was afebrile and his white blood cell count was within normal limits on admission. He received 4-1/2 days of IV Unasyn. He was discharged on 2 more days of amoxicillin.  Bilateral pleural effusion. Patient's follow-up chest x-ray on 04/17/16 revealed layering bilateral pleural effusions with bibasilar airspace opacities. -Effusions were likely parapneumonic due to his aspiration pneumonia. -Although the effusions were not necessarily transudative his I/O was positive, so he was given 1 dose of IV Lasix empirically.   Acute on chronic respiratory failure with hypoxia, secondary to pneumonia. Patient's oxygen saturation was 88% on room air in the ED. He was started on oxygen. His 02 saturations have improved on oxygen. Later, it was noted that the patient had already been on oxygen at the skilled nursing facility. He was discharged on his usual dose of 2-3 L/m. His oxygenation was 95% on 2 L of nasal cannula oxygen on exam.  Elevated troponin I secondary to non-ST elevation MI versus demand ischemia from infection and atrial fibrillation. Patient's troponin I was 0.47 on admission. His EKG revealed atrial fibrillation without concerning ST elevations. -Patient's troponin I  trended downward. 2-D echocardiogram ordered for evaluation and revealed no LV wall motion  abnormalities and an EF of 60-65%.  -He is treated chronically with Plavix and isosorbide dinitrate; they were continued. Toprol was added at 12.5 mg daily. -Given his chronic debilitation and dementia and normal EF, he was not a candidate for invasive diagnostic workup.   Newly diagnosed- onset atrial fibrillation. -Patient's EKG on admission and follow-up EKG 04/14/16 revealed atrial fibrillation. There appeared to be no known recorded history of atrial fibrillation. His rate was controlled throughout the hospitalization. -Plavix was continued. He is not a candidate for anticoagulation due to severe dementia and risk of falls. Low-dose Toprol-XL was added. 2-D echocardiogram results noted. Patient continued to be in atrial fibrillation per exam.  Acute kidney injury. Patient's creatinine was 1.31 on admission. He was started on IV fluids. His creatinine improved to 1.05.Marland Kitchen. Etiology of his acute renal injury was secondary to prerenal azotemia and dehydration.  Hypernatremia. Patient's serum sodium was 147 on admission. He was started on half-normal saline with improvement in his serum sodium to 142. Hypokalemia. The patient's serum potassium was within normal limits on admission, but has fallen slightly with hypotonic IV fluids. -Oral potassium as tolerated was given and potassium was added to the IV fluids. Serum potassium has improved.  Advanced dementia with a history of behavioral disturbance. Per chart review, the patient has a history of alcoholism which is mentioned as the etiology of his dementia. He appears to be treated chronically with Risperdal and valproic acid and Lexapro and clonazepam. All were restarted. -He has demonstrated some confusion necessitating a sitter.  Acute encephalopathy with lethargy on admission. Etiology multifactorial, but primarily secondary to infection in the  setting of advanced dementia.  Hypothyroidism. Synthroid was restarted. His TSH and free T4 were within normal limits.  Normocytic anemia. Likely of chronic disease; the decrease in his hemoglobin was likely from the dilutional effects of IV fluids.. Hemoglobin was 9.7 at the time of discharge.  Mild-Moderate oropharyngeal dysphagia. The speech therapist evaluated the patient at the bedside and it yielded moderate dysphagia and aspiration. His diet was downgraded to dysphagia 2 with nectar thickened liquids.  DO NOT RESUSCITATE status. Patient is chronically demented and debilitated. Palliative care consulted. Palliative care NP, Ms. Dove discussed goals of care, hospice, and disposition with patient's sister-in-law Steward DroneBrenda. Following their discussion, Steward DroneBrenda was in agreement with instituting hospice services for the patient. Therefore, the patient will be discharged to skilled nursing facility with hospice. Hospice will need to be consulted when the patient arrives back to the facility.   Procedures: Echo 04/14/16: - Left ventricle: The cavity size was normal. There was moderate  concentric hypertrophy. Systolic function was normal. The  estimated ejection fraction was in the range of 60% to 65%. Wall  motion was normal; there were no regional wall motion  abnormalities. The study was not technically sufficient to allow  evaluation of LV diastolic dysfunction due to atrial  fibrillation. Doppler parameters are consistent with high  ventricular filling pressure. - Aortic valve: Moderately calcified annulus. Mildly thickened,  mildly calcified leaflets. Cusp separation was reduced. There was  mild to moderate stenosis. Peak velocity (S): 216 cm/s. Mean  gradient (S): 11 mm Hg. Valve area (VTI): 1.22 cm^2. Valve area  (Vmax): 1.32 cm^2. Valve area (Vmean): 1.2 cm^2. - Mitral valve: Mildly thickened leaflets . There was moderate to  severe regurgitation. - Left atrium: The  atrium was mildly to moderately dilated. - Atrial septum: No defect or patent foramen ovale was identified. - Tricuspid valve: There was moderate regurgitation. -  Pulmonary arteries: Systolic pressure was mildly to moderately  increased. PA peak pressure: 48 mm Hg (S). - Systemic veins: IVC dilated with normal respiratory variation.  Estimated CVP 8 mmHg.  Consultations:  Palliative care  Discharge Exam: Filed Vitals:   04/17/16 2231 04/18/16 0538  BP: 134/90 121/60  Pulse: 83 70  Temp: 99.7 F (37.6 C) 98.6 F (37 C)  Resp: 20 15   oxygen saturation 95% on 2 L of oxygen.  Respiratory system: Lungs with rare upper airway crackles; decreased breath sounds in the bases. Respiratory effort normal. Cardiovascular system: Irregular, irregular. No pedal edema. Gastrointestinal system: Abdomen is nondistended, soft and nontender. No organomegaly or masses felt. Normal bowel sounds heard. Central nervous system: He is alert and confused. Speaking to himself. He is very hard of hearing. Extremities: No pedal edema. Skin: No rashes, lesions or ulcers Psychiatry: Flat affect and confused.  Discharge Instructions   Discharge Instructions    Discharge instructions    Complete by:  As directed   Patient will need assistance with all meals. diet is dysphagia 2 with nectar thickened liquids.     Increase activity slowly    Complete by:  As directed           Current Discharge Medication List    START taking these medications   Details  amoxicillin (AMOXIL) 500 MG tablet Take 1 tablet (500 mg total) by mouth 2 (two) times daily. Antibiotic to be continued for 2 more days; discontinue after dose on 04/20/16.    metoprolol succinate (TOPROL-XL) 25 MG 24 hr tablet Take 0.5 tablets (12.5 mg total) by mouth daily.      CONTINUE these medications which have NOT CHANGED   Details  acetaminophen (TYLENOL) 325 MG tablet Take 650 mg by mouth every 6 (six) hours as needed.    bisacodyl  (DULCOLAX) 10 MG suppository Place 10 mg rectally every 3 (three) days as needed for moderate constipation.    clonazePAM (KLONOPIN) 0.25 MG disintegrating tablet Take 0.25 mg by mouth at bedtime.    clopidogrel (PLAVIX) 75 MG tablet Take 75 mg by mouth daily.    escitalopram (LEXAPRO) 10 MG tablet Take 10 mg by mouth daily.    isosorbide dinitrate (ISORDIL) 30 MG tablet Take 30 mg by mouth daily.    lactulose (CHRONULAC) 10 GM/15ML solution Place 10 g rectally every 3 (three) days as needed for mild constipation.    levothyroxine (SYNTHROID, LEVOTHROID) 112 MCG tablet Take 112 mcg by mouth daily before breakfast.    LORazepam (ATIVAN) 2 MG/ML injection Inject 2 mg into the vein every 2 (two) hours as needed for anxiety.    Multiple Vitamins-Minerals (MULTIVITAMIN WITH MINERALS) tablet Take 1 tablet by mouth daily.    OXYGEN Inhale 2 L into the lungs continuous.    risperiDONE (RISPERDAL) 2 MG tablet Take 2 mg by mouth 2 (two) times daily.    senna (SENOKOT) 8.6 MG tablet Take 2 tablets by mouth daily.    Valproic Acid 250 MG CPDR Take 1 capsule by mouth 3 (three) times daily.       No Known Allergies    The results of significant diagnostics from this hospitalization (including imaging, microbiology, ancillary and laboratory) are listed below for reference.    Significant Diagnostic Studies: Dg Chest Port 1 View  04/17/2016  CLINICAL DATA:  Patient with history of pneumonia. EXAM: PORTABLE CHEST 1 VIEW COMPARISON:  Chest radiograph 04/13/2016. FINDINGS: Patient is rotated to the right. Monitoring leads overlie  the patient. Cardiomegaly. Status post median sternotomy. Layering bilateral pleural effusions. Underlying pulmonary consolidation. IMPRESSION: Layering bilateral pleural effusions with underlying bibasilar airspace opacities which may represent atelectasis or infection. Electronically Signed   By: Annia Belt M.D.   On: 04/17/2016 10:00   Dg Chest Portable 1  View  04/13/2016  CLINICAL DATA:  Cough, possible pneumonia EXAM: PORTABLE CHEST 1 VIEW COMPARISON:  None. FINDINGS: Cardiomediastinal silhouette is unremarkable. There is hazy infiltrate in right base suspicious for pneumonia. No pulmonary edema. Mild left basilar atelectasis or infiltrate. IMPRESSION: There is hazy infiltrate in right base suspicious for pneumonia. No pulmonary edema. Mild left basilar atelectasis or infiltrate. Followup PA and lateral chest X-ray is recommended in 3-4 weeks following trial of antibiotic therapy to ensure resolution and exclude underlying malignancy. Electronically Signed   By: Natasha Mead M.D.   On: 04/13/2016 10:43   Dg Swallowing Func-speech Pathology  04/14/2016  Objective Swallowing Evaluation: Type of Study: MBS-Modified Barium Swallow Study Patient Details Name: Dene Nazir MRN: [id number] Date of Birth: 1926-09-12 Today's Date: 04/14/2016 Time: SLP Start Time (ACUTE ONLY): 1434-SLP Stop Time (ACUTE ONLY): 1451 SLP Time Calculation (min) (ACUTE ONLY): 17 min Past Medical History: Past Medical History Diagnosis Date . Ischemic heart disease    on plavix . Encephalopathy  . Hypertension  . Alcohol dependence with alcohol-induced persisting dementia (HCC)  . Dysphagia  . GAD (generalized anxiety disorder)  . CKD (chronic kidney disease)  . MDD (major depressive disorder) (HCC)  . Hyperlipemia  . Cognitive communication deficit  . GERD (gastroesophageal reflux disease)  . Hypothyroidism  . Anemia  . Dementia  . Delusional disorder (HCC)  . Seizures (HCC)    on Valproic acid . Coronary artery disease  . Former smoker, stopped smoking in distant past  Past Surgical History: No past surgical history on file. HPI: [name] is a 82 y/o man brought to ED from nursing home where he was noted to have worsening cough and increased sputum production. These symptoms were progressive over the past week. He has not been clearly febrile during the interval. His mental status is  questionably worsened with incoherent speech since this morning, however he has severe dementia at baseline. After arrival in the ED he is noted to be mildly hypoxic with O2 saturation <88% that improves with supplemental oxygen. Heart rate mildly tachycardic in Afib which is not documented in nursing home records PTA. He was recently discharged from [facility] on 4/21 with dx of PNA. Chest X-Ray reveals "There is hazy infiltrate in right base suspicious for pneumonia." Assessment / Plan / Recommendation CHL IP CLINICAL IMPRESSIONS 04/14/2016 Therapy Diagnosis Mild oral phase dysphagia;Moderate oral phase dysphagia;Mild pharyngeal phase dysphagia;Moderate pharyngeal phase dysphagia Clinical Impression Pt presents with mild/moderate oropharyngeal dysphagia of unknown etiology. Pt consumed thin liquids via cup and straw demonstrating uncoordinated bolus cohesion, delayed oral transit with poor awareness of bolus resulting in premature spillage and a delayed swallow that was initiated at the level of the pyriform sinuses, Inconsistent NOTED ASPIRATION before and during the swallow that was sensed but not cleared despite strong reflexive cough. Decreased base of tongue retration resulting in valleculae residue with thin and nectar trials. Nectar trials yielded uncoordinated oral stage and a gross amount of premature spillage with delayed swallow initiation to the level of the pyriform sinuses and no penetration or aspiration although mild vallecular residue was noted but cleared with reflexive and voluntary (verbal cues to provide) second swallow. Pt consumed puree and  mechanical soft textures demonstrating uncoordinated bolus cohesion, some piecemeal deglutition, swallow initiation at the level of the valleculae and mild valleculae residue after the swallow cleared with reflexive second swallow. Recommend D2 (ground)/Nectar Thick Liquids and meds to be administered crushed in puree.  Impact on safety and function  Moderate aspiration risk   CHL IP TREATMENT RECOMMENDATION 04/14/2016 Treatment Recommendations Therapy as outlined in treatment plan below   Prognosis 04/14/2016 Prognosis for Safe Diet Advancement Fair Barriers to Reach Goals Cognitive deficits Barriers/Prognosis Comment -- CHL IP DIET RECOMMENDATION 04/14/2016 SLP Diet Recommendations Dysphagia 2 (Fine chop) solids;Nectar thick liquid Liquid Administration via Cup;Straw Medication Administration Crushed with puree Compensations Slow rate;Small sips/bites;Follow solids with liquid Postural Changes Seated upright at 90 degrees   CHL IP OTHER RECOMMENDATIONS 04/14/2016 Recommended Consults -- Oral Care Recommendations Oral care BID Other Recommendations Order thickener from pharmacy   CHL IP FOLLOW UP RECOMMENDATIONS 04/14/2016 Follow up Recommendations Skilled Nursing facility   [facility] IP FREQUENCY AND DURATION 04/14/2016 Speech Therapy Frequency (ACUTE ONLY) min 2x/week Treatment Duration 2 weeks      CHL IP ORAL PHASE 04/14/2016 Oral Phase Impaired Oral - Pudding Teaspoon -- Oral - Pudding Cup -- Oral - Honey Teaspoon -- Oral - Honey Cup -- Oral - Nectar Teaspoon -- Oral - Nectar Cup Holding of bolus;Delayed oral transit;Decreased bolus cohesion;Premature spillage Oral - Nectar Straw Premature spillage;Decreased bolus cohesion;Delayed oral transit;Holding of bolus Oral - Thin Teaspoon -- Oral - Thin Cup Premature spillage;Decreased bolus cohesion;Delayed oral transit;Holding of bolus Oral - Thin Straw Premature spillage;Decreased bolus cohesion;Delayed oral transit;Holding of bolus Oral - Puree Delayed oral transit;Premature spillage;Decreased bolus cohesion Oral - Mech Soft Premature spillage;Decreased bolus cohesion;Delayed oral transit;Impaired mastication;Weak lingual manipulation Oral - Regular -- Oral - Multi-Consistency -- Oral - Pill -- Oral Phase - Comment --  CHL IP PHARYNGEAL PHASE 04/14/2016 Pharyngeal Phase Impaired Pharyngeal- Pudding Teaspoon -- Pharyngeal --  Pharyngeal- Pudding Cup -- Pharyngeal -- Pharyngeal- Honey Teaspoon -- Pharyngeal -- Pharyngeal- Honey Cup -- Pharyngeal -- Pharyngeal- Nectar Teaspoon -- Pharyngeal -- Pharyngeal- Nectar Cup Delayed swallow initiation-pyriform sinuses;Pharyngeal residue - valleculae;Reduced tongue base retraction Pharyngeal -- Pharyngeal- Nectar Straw Delayed swallow initiation-pyriform sinuses;Pharyngeal residue - valleculae;Reduced tongue base retraction Pharyngeal -- Pharyngeal- Thin Teaspoon -- Pharyngeal -- Pharyngeal- Thin Cup Delayed swallow initiation-pyriform sinuses;Pharyngeal residue - valleculae;Penetration/Aspiration before swallow;Penetration/Aspiration during swallow;Reduced tongue base retraction;Reduced airway/laryngeal closure Pharyngeal Material enters airway, passes BELOW cords and not ejected out despite cough attempt by patient Pharyngeal- Thin Straw Delayed swallow initiation-pyriform sinuses;Penetration/Aspiration before swallow;Penetration/Aspiration during swallow;Reduced airway/laryngeal closure;Pharyngeal residue - valleculae Pharyngeal Material enters airway, passes BELOW cords and not ejected out despite cough attempt by patient Pharyngeal- Puree Delayed swallow initiation-vallecula;Pharyngeal residue - valleculae Pharyngeal -- Pharyngeal- Mechanical Soft Delayed swallow initiation-vallecula Pharyngeal -- Pharyngeal- Regular -- Pharyngeal -- Pharyngeal- Multi-consistency -- Pharyngeal -- Pharyngeal- Pill -- Pharyngeal -- Pharyngeal Comment --  Amelia H. Romie Levee, CCC-SLP Speech Language Pathologist Georgetta Haber 04/14/2016, 3:36 PM               Microbiology: Recent Results (from the past 240 hour(s))  MRSA PCR Screening     Status: None   Collection Time: 04/13/16  4:20 PM  Result Value Ref Range Status   MRSA by PCR NEGATIVE NEGATIVE Final    Comment:        The GeneXpert MRSA Assay (FDA approved for NASAL specimens only), is one component of a comprehensive MRSA  colonization surveillance program. It is not intended to diagnose MRSA infection nor  to guide or monitor treatment for MRSA infections.      Labs: Basic Metabolic Panel:  Recent Labs Lab 04/13/16 1030 04/14/16 0511 04/15/16 0504 04/16/16 0554 04/17/16 0805  NA 147* 146* 146* 144 142  K 3.6 3.4* 3.6 4.2 5.0  CL 109 110 109 109 108  CO2 GLUCOSE 129* 96 104* 107* 90  BUN 27* 25* 22* 17 16  CREATININE 1.31* 1.06 0.98 0.99 1.05  CALCIUM 8.9 8.3* 8.5* 8.5* 8.7*   Liver Function Tests:  Recent Labs Lab 04/13/16 1030  AST 23  ALT 21  ALKPHOS 47  BILITOT 0.6  PROT 7.0  ALBUMIN 2.9*   No results for input(s): LIPASE, AMYLASE in the last 168 hours. No results for input(s): AMMONIA in the last 168 hours. CBC:  Recent Labs Lab 04/13/16 1030 04/14/16 0511 04/15/16 0504 04/16/16 0554 04/17/16 0805  WBC 14.4* 8.7 7.7 7.3 7.4  NEUTROABS 11.8*  --   --   --   --   HGB 8.9* 9.3* 9.5* 9.8* 9.7*  HCT 27.6* 28.7* 29.8* 30.6* 30.7*  MCV 90.8 92.0 92.0 92.4 93.0  PLT 299 222 270 249 254   Cardiac Enzymes:  Recent Labs Lab 04/13/16 1030 04/13/16 1714 04/13/16 2248 04/14/16 0511  TROPONINI 0.47* 0.41* 0.36* 0.32*   BNP: BNP (last 3 results) No results for input(s): BNP in the last 8760 hours.  ProBNP (last 3 results) No results for input(s): PROBNP in the last 8760 hours.  CBG: No results for input(s): GLUCAP in the last 168 hours.     Signed:  Dorrie Cocuzza MD.  [facility] 04/18/2016, 11:25 AM

## 2016-04-18 NOTE — Progress Notes (Signed)
[facility] [place]. Pt discharged via EMS to facility

## 2016-05-20 DEATH — deceased

## 2017-09-10 IMAGING — CR DG CHEST 1V PORT
1 series · 1 of 1 positions shown · non-contrast
Comparison: None.

CLINICAL DATA: Cough, possible pneumonia

EXAM:
PORTABLE CHEST 1 VIEW

[ap portable]
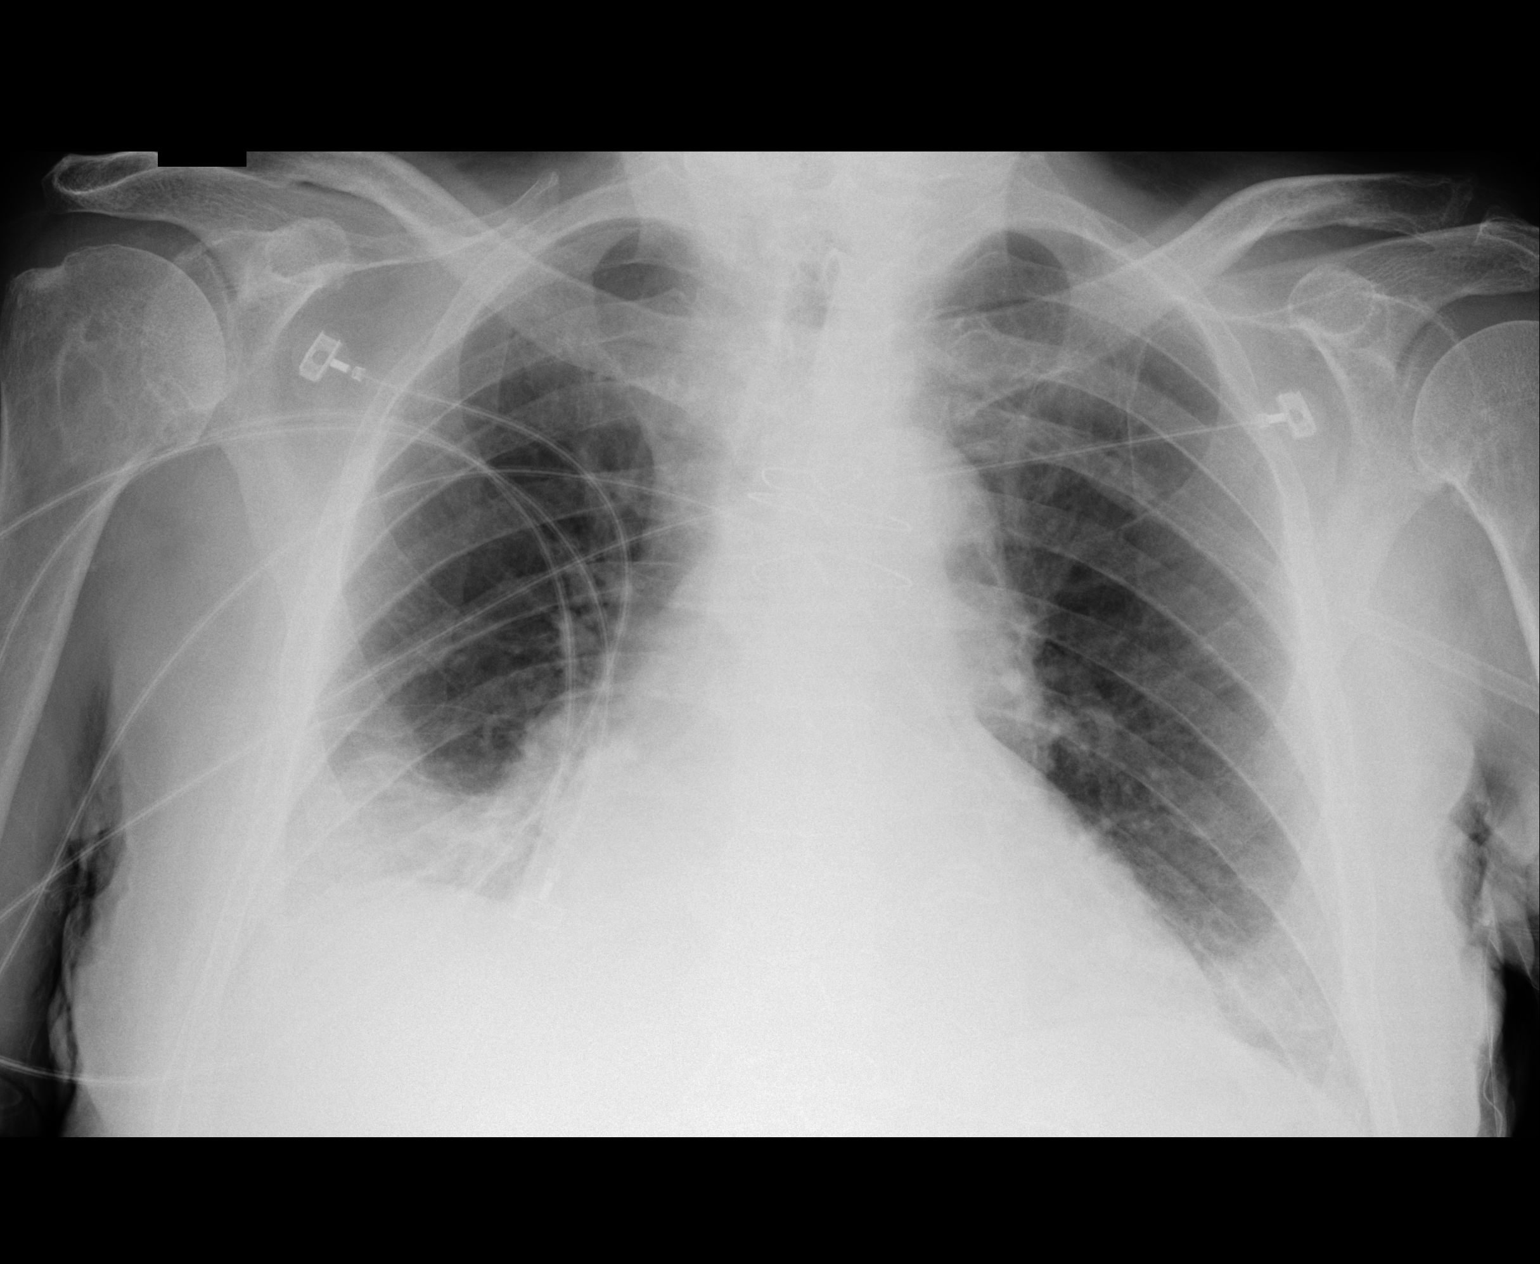

[1 of 1 positions shown; findings below may reference images not displayed]

FINDINGS: Cardiomediastinal silhouette is unremarkable. There is hazy
infiltrate in right base suspicious for pneumonia. No pulmonary
edema. Mild left basilar atelectasis or infiltrate.
IMPRESSION: There is hazy infiltrate in right base suspicious for pneumonia. No
pulmonary edema. Mild left basilar atelectasis or infiltrate.
Followup PA and lateral chest X-ray is recommended in 3-4 weeks
following trial of antibiotic therapy to ensure resolution and
exclude underlying malignancy.
